# Patient Record
Sex: Female | Born: 1940 | Race: White | Hispanic: No | Marital: Single
Health system: Southern US, Community
[De-identification: ages and names within clinical notes are randomized; demographics above are authoritative.]

---

## 2005-05-11 ENCOUNTER — Encounter: Admission: RE | Admit: 2005-05-11 | Discharge: 2005-05-11 | Payer: Self-pay | Admitting: Neurology

## 2006-01-15 ENCOUNTER — Other Ambulatory Visit: Payer: Self-pay

## 2006-01-15 ENCOUNTER — Inpatient Hospital Stay: Payer: Self-pay | Admitting: Internal Medicine

## 2006-01-16 ENCOUNTER — Other Ambulatory Visit: Payer: Self-pay

## 2006-01-17 ENCOUNTER — Other Ambulatory Visit: Payer: Self-pay

## 2006-01-22 ENCOUNTER — Ambulatory Visit: Payer: Self-pay | Admitting: Specialist

## 2006-03-04 ENCOUNTER — Emergency Department: Payer: Self-pay | Admitting: Emergency Medicine

## 2006-03-05 ENCOUNTER — Other Ambulatory Visit: Payer: Self-pay

## 2009-10-06 ENCOUNTER — Ambulatory Visit: Payer: Self-pay | Admitting: Cardiology

## 2009-10-06 ENCOUNTER — Inpatient Hospital Stay: Payer: Self-pay | Admitting: Internal Medicine

## 2011-03-07 ENCOUNTER — Inpatient Hospital Stay: Payer: Self-pay | Admitting: Specialist

## 2012-01-13 ENCOUNTER — Ambulatory Visit: Payer: Self-pay | Admitting: Internal Medicine

## 2012-01-13 ENCOUNTER — Ambulatory Visit: Payer: Self-pay | Admitting: Oncology

## 2012-01-30 LAB — COMPREHENSIVE METABOLIC PANEL
Albumin: 3.3 g/dL — ABNORMAL LOW (ref 3.4–5.0)
Alkaline Phosphatase: 60 U/L (ref 50–136)
Anion Gap: 9 (ref 7–16)
BUN: 7 mg/dL (ref 7–18)
Bilirubin,Total: 0.6 mg/dL (ref 0.2–1.0)
Calcium, Total: 8.5 mg/dL (ref 8.5–10.1)
Chloride: 96 mmol/L — ABNORMAL LOW (ref 98–107)
Co2: 23 mmol/L (ref 21–32)
Creatinine: 0.64 mg/dL (ref 0.60–1.30)
EGFR (African American): >60
EGFR (Non-African Amer.): >60
Glucose: 101 mg/dL — ABNORMAL HIGH (ref 65–99)
Osmolality: 255 (ref 275–301)
Potassium: 4.3 mmol/L (ref 3.5–5.1)
SGOT(AST): 21 U/L (ref 15–37)
SGPT (ALT): 15 U/L
Sodium: 128 mmol/L — ABNORMAL LOW (ref 136–145)
Total Protein: 6.3 g/dL — ABNORMAL LOW (ref 6.4–8.2)

## 2012-01-30 LAB — CBC
HCT: 33.2 % — ABNORMAL LOW (ref 35.0–47.0)
HGB: 11.3 g/dL — ABNORMAL LOW (ref 12.0–16.0)
MCH: 32.9 pg (ref 26.0–34.0)
MCHC: 33.9 g/dL (ref 32.0–36.0)
MCV: 97 fL (ref 80–100)
Platelet: 226 10*3/uL (ref 150–440)
RBC: 3.43 10*6/uL — ABNORMAL LOW (ref 3.80–5.20)
RDW: 13.8 % (ref 11.5–14.5)
WBC: 11.6 10*3/uL — ABNORMAL HIGH (ref 3.6–11.0)

## 2012-01-30 LAB — CK TOTAL AND CKMB (NOT AT ARMC)
CK, Total: 23 U/L (ref 21–215)
CK-MB: 0.6 ng/mL (ref 0.5–3.6)

## 2012-01-30 LAB — TROPONIN I: Troponin-I: <0.02 ng/mL

## 2012-01-30 LAB — PRO B NATRIURETIC PEPTIDE: B-Type Natriuretic Peptide: 8677 pg/mL — ABNORMAL HIGH (ref 0–125)

## 2012-01-31 ENCOUNTER — Inpatient Hospital Stay: Payer: Self-pay | Admitting: Internal Medicine

## 2012-01-31 LAB — URINALYSIS, COMPLETE
Bacteria: NONE SEEN
Bilirubin,UR: NEGATIVE
Blood: NEGATIVE
Glucose,UR: NEGATIVE mg/dL (ref 0–75)
Nitrite: NEGATIVE
Ph: 7 (ref 4.5–8.0)
Protein: 30
RBC,UR: NONE SEEN /[HPF] (ref 0–5)
Specific Gravity: 1.01 (ref 1.003–1.030)
Squamous Epithelial: 7
WBC UR: 3 /[HPF] (ref 0–5)

## 2012-01-31 LAB — TROPONIN I
Troponin-I: 0.17 ng/mL — ABNORMAL HIGH
Troponin-I: 0.08 ng/mL — ABNORMAL HIGH

## 2012-02-01 LAB — CBC WITH DIFFERENTIAL/PLATELET
Basophil #: 0.1 10*3/uL (ref 0.0–0.1)
Basophil %: 0.6 %
Eosinophil #: 0.4 10*3/uL (ref 0.0–0.7)
Eosinophil %: 4.3 %
HCT: 30.7 % — ABNORMAL LOW (ref 35.0–47.0)
HGB: 10.4 g/dL — ABNORMAL LOW (ref 12.0–16.0)
Lymphocyte %: 27.9 %
Lymphs Abs: 2.5 10*3/uL (ref 1.0–3.6)
MCH: 33 pg (ref 26.0–34.0)
MCHC: 34 g/dL (ref 32.0–36.0)
MCV: 97 fL (ref 80–100)
Monocyte #: 1 x10 3/mm — ABNORMAL HIGH (ref 0.2–0.9)
Monocyte %: 10.7 %
Neutrophil #: 5.1 10*3/uL (ref 1.4–6.5)
Neutrophil %: 56.5 %
Platelet: 176 10*3/uL (ref 150–440)
RBC: 3.16 10*6/uL — ABNORMAL LOW (ref 3.80–5.20)
RDW: 13.6 % (ref 11.5–14.5)
WBC: 8.9 10*3/uL (ref 3.6–11.0)

## 2012-02-01 LAB — COMPREHENSIVE METABOLIC PANEL
Albumin: 3.2 g/dL — ABNORMAL LOW (ref 3.4–5.0)
Alkaline Phosphatase: 52 U/L (ref 50–136)
Anion Gap: 6 — ABNORMAL LOW (ref 7–16)
BUN: 14 mg/dL (ref 7–18)
Bilirubin,Total: 0.4 mg/dL (ref 0.2–1.0)
Calcium, Total: 8.1 mg/dL — ABNORMAL LOW (ref 8.5–10.1)
Chloride: 95 mmol/L — ABNORMAL LOW (ref 98–107)
Co2: 30 mmol/L (ref 21–32)
Creatinine: 0.93 mg/dL (ref 0.60–1.30)
EGFR (African American): >60
EGFR (Non-African Amer.): >60
Glucose: 106 mg/dL — ABNORMAL HIGH (ref 65–99)
Osmolality: 264 (ref 275–301)
Potassium: 3.8 mmol/L (ref 3.5–5.1)
SGOT(AST): 14 U/L — ABNORMAL LOW (ref 15–37)
SGPT (ALT): 13 U/L
Sodium: 131 mmol/L — ABNORMAL LOW (ref 136–145)
Total Protein: 5.7 g/dL — ABNORMAL LOW (ref 6.4–8.2)

## 2012-02-01 LAB — MAGNESIUM: Magnesium: 1.3 mg/dL — ABNORMAL LOW

## 2012-02-05 LAB — CREATININE, SERUM
Creatinine: 1 mg/dL (ref 0.60–1.30)
EGFR (African American): >60
EGFR (Non-African Amer.): 56 — ABNORMAL LOW

## 2012-02-06 LAB — PLATELET COUNT: Platelet: 215 10*3/uL (ref 150–440)

## 2012-02-13 ENCOUNTER — Ambulatory Visit: Payer: Self-pay | Admitting: Oncology

## 2012-02-13 ENCOUNTER — Ambulatory Visit: Payer: Self-pay | Admitting: Internal Medicine

## 2012-02-22 ENCOUNTER — Ambulatory Visit: Payer: Self-pay | Admitting: Oncology

## 2012-04-14 ENCOUNTER — Ambulatory Visit: Payer: Self-pay | Admitting: Internal Medicine

## 2012-04-16 ENCOUNTER — Inpatient Hospital Stay: Payer: Self-pay | Admitting: Student

## 2012-04-16 LAB — CBC
HCT: 33.8 % — ABNORMAL LOW (ref 35.0–47.0)
HGB: 11.6 g/dL — ABNORMAL LOW (ref 12.0–16.0)
MCH: 32.5 pg (ref 26.0–34.0)
MCHC: 34.2 g/dL (ref 32.0–36.0)
MCV: 95 fL (ref 80–100)
Platelet: 252 10*3/uL (ref 150–440)
RBC: 3.56 10*6/uL — ABNORMAL LOW (ref 3.80–5.20)
RDW: 15.9 % — ABNORMAL HIGH (ref 11.5–14.5)
WBC: 12 10*3/uL — ABNORMAL HIGH (ref 3.6–11.0)

## 2012-04-16 LAB — COMPREHENSIVE METABOLIC PANEL
Albumin: 3.5 g/dL (ref 3.4–5.0)
Alkaline Phosphatase: 95 U/L (ref 50–136)
Anion Gap: 8 (ref 7–16)
BUN: 13 mg/dL (ref 7–18)
Bilirubin,Total: 0.6 mg/dL (ref 0.2–1.0)
Calcium, Total: 8.5 mg/dL (ref 8.5–10.1)
Chloride: 99 mmol/L (ref 98–107)
Co2: 25 mmol/L (ref 21–32)
Creatinine: 0.73 mg/dL (ref 0.60–1.30)
EGFR (African American): >60
EGFR (Non-African Amer.): >60
Glucose: 124 mg/dL — ABNORMAL HIGH (ref 65–99)
Osmolality: 266 (ref 275–301)
Potassium: 4.4 mmol/L (ref 3.5–5.1)
SGOT(AST): 38 U/L — ABNORMAL HIGH (ref 15–37)
SGPT (ALT): 43 U/L (ref 12–78)
Sodium: 132 mmol/L — ABNORMAL LOW (ref 136–145)
Total Protein: 6.9 g/dL (ref 6.4–8.2)

## 2012-04-16 LAB — PRO B NATRIURETIC PEPTIDE: B-Type Natriuretic Peptide: 6291 pg/mL — ABNORMAL HIGH (ref 0–125)

## 2012-04-16 LAB — TROPONIN I: Troponin-I: 0.04 ng/mL

## 2012-04-16 LAB — CK TOTAL AND CKMB (NOT AT ARMC)
CK, Total: 51 U/L (ref 21–215)
CK-MB: 1.5 ng/mL (ref 0.5–3.6)

## 2012-04-17 LAB — COMPREHENSIVE METABOLIC PANEL
Albumin: 3 g/dL — ABNORMAL LOW (ref 3.4–5.0)
Alkaline Phosphatase: 84 U/L (ref 50–136)
Anion Gap: 8 (ref 7–16)
BUN: 21 mg/dL — ABNORMAL HIGH (ref 7–18)
Bilirubin,Total: 0.4 mg/dL (ref 0.2–1.0)
Calcium, Total: 8.3 mg/dL — ABNORMAL LOW (ref 8.5–10.1)
Chloride: 98 mmol/L (ref 98–107)
Co2: 26 mmol/L (ref 21–32)
Creatinine: 0.92 mg/dL (ref 0.60–1.30)
EGFR (African American): >60
EGFR (Non-African Amer.): >60
Glucose: 153 mg/dL — ABNORMAL HIGH (ref 65–99)
Osmolality: 271 (ref 275–301)
Potassium: 4.8 mmol/L (ref 3.5–5.1)
SGOT(AST): 40 U/L — ABNORMAL HIGH (ref 15–37)
SGPT (ALT): 57 U/L (ref 12–78)
Sodium: 132 mmol/L — ABNORMAL LOW (ref 136–145)
Total Protein: 6.2 g/dL — ABNORMAL LOW (ref 6.4–8.2)

## 2012-04-17 LAB — CBC WITH DIFFERENTIAL/PLATELET
Basophil #: 0 10*3/uL (ref 0.0–0.1)
Basophil %: 0.1 %
Eosinophil #: 0 10*3/uL (ref 0.0–0.7)
Eosinophil %: 0.1 %
HCT: 29.6 % — ABNORMAL LOW (ref 35.0–47.0)
HGB: 10 g/dL — ABNORMAL LOW (ref 12.0–16.0)
Lymphocyte %: 9.3 %
Lymphs Abs: 0.6 10*3/uL — ABNORMAL LOW (ref 1.0–3.6)
MCH: 32.5 pg (ref 26.0–34.0)
MCHC: 33.9 g/dL (ref 32.0–36.0)
MCV: 96 fL (ref 80–100)
Monocyte #: 0.5 x10 3/mm (ref 0.2–0.9)
Monocyte %: 7 %
Neutrophil #: 5.6 10*3/uL (ref 1.4–6.5)
Neutrophil %: 83.5 %
Platelet: 212 10*3/uL (ref 150–440)
RBC: 3.09 10*6/uL — ABNORMAL LOW (ref 3.80–5.20)
RDW: 15.8 % — ABNORMAL HIGH (ref 11.5–14.5)
WBC: 6.7 10*3/uL (ref 3.6–11.0)

## 2012-04-18 LAB — BASIC METABOLIC PANEL
Anion Gap: 7 (ref 7–16)
BUN: 26 mg/dL — ABNORMAL HIGH (ref 7–18)
Calcium, Total: 8.2 mg/dL — ABNORMAL LOW (ref 8.5–10.1)
Chloride: 97 mmol/L — ABNORMAL LOW (ref 98–107)
Co2: 28 mmol/L (ref 21–32)
Creatinine: 1.04 mg/dL (ref 0.60–1.30)
EGFR (African American): >60
EGFR (Non-African Amer.): 54 — ABNORMAL LOW
Glucose: 151 mg/dL — ABNORMAL HIGH (ref 65–99)
Osmolality: 272 (ref 275–301)
Potassium: 4.7 mmol/L (ref 3.5–5.1)
Sodium: 132 mmol/L — ABNORMAL LOW (ref 136–145)

## 2012-04-19 LAB — BASIC METABOLIC PANEL
Anion Gap: 7 (ref 7–16)
BUN: 29 mg/dL — ABNORMAL HIGH (ref 7–18)
Calcium, Total: 8.1 mg/dL — ABNORMAL LOW (ref 8.5–10.1)
Chloride: 94 mmol/L — ABNORMAL LOW (ref 98–107)
Co2: 29 mmol/L (ref 21–32)
Creatinine: 0.91 mg/dL (ref 0.60–1.30)
EGFR (African American): >60
EGFR (Non-African Amer.): >60
Glucose: 131 mg/dL — ABNORMAL HIGH (ref 65–99)
Osmolality: 268 (ref 275–301)
Potassium: 4.7 mmol/L (ref 3.5–5.1)
Sodium: 130 mmol/L — ABNORMAL LOW (ref 136–145)

## 2012-04-19 LAB — PRO B NATRIURETIC PEPTIDE: B-Type Natriuretic Peptide: 40773 pg/mL — ABNORMAL HIGH (ref 0–125)

## 2012-05-12 LAB — BASIC METABOLIC PANEL
Anion Gap: 7 (ref 7–16)
BUN: 18 mg/dL (ref 7–18)
Calcium, Total: 8.8 mg/dL (ref 8.5–10.1)
Chloride: 85 mmol/L — ABNORMAL LOW (ref 98–107)
Co2: 29 mmol/L (ref 21–32)
Creatinine: 1.01 mg/dL (ref 0.60–1.30)
EGFR (African American): >60
EGFR (Non-African Amer.): 56 — ABNORMAL LOW
Glucose: 81 mg/dL (ref 65–99)
Osmolality: 245 (ref 275–301)
Potassium: 4 mmol/L (ref 3.5–5.1)
Sodium: 121 mmol/L — ABNORMAL LOW (ref 136–145)

## 2012-05-12 LAB — CBC
HCT: 32.3 % — ABNORMAL LOW (ref 35.0–47.0)
HGB: 11.3 g/dL — ABNORMAL LOW (ref 12.0–16.0)
MCH: 33.2 pg (ref 26.0–34.0)
MCHC: 35 g/dL (ref 32.0–36.0)
MCV: 95 fL (ref 80–100)
Platelet: 193 10*3/uL (ref 150–440)
RBC: 3.4 10*6/uL — ABNORMAL LOW (ref 3.80–5.20)
RDW: 14.5 % (ref 11.5–14.5)
WBC: 4.4 10*3/uL (ref 3.6–11.0)

## 2012-05-12 LAB — TROPONIN I: Troponin-I: <0.02 ng/mL

## 2012-05-12 LAB — CK TOTAL AND CKMB (NOT AT ARMC)
CK, Total: 73 U/L (ref 21–215)
CK-MB: 2.4 ng/mL (ref 0.5–3.6)

## 2012-05-13 ENCOUNTER — Inpatient Hospital Stay: Payer: Self-pay | Admitting: Student

## 2012-05-13 LAB — PRO B NATRIURETIC PEPTIDE: B-Type Natriuretic Peptide: 2009 pg/mL — ABNORMAL HIGH (ref 0–125)

## 2012-05-13 LAB — BASIC METABOLIC PANEL
Anion Gap: 8 (ref 7–16)
BUN: 16 mg/dL (ref 7–18)
Calcium, Total: 8.5 mg/dL (ref 8.5–10.1)
Chloride: 86 mmol/L — ABNORMAL LOW (ref 98–107)
Co2: 29 mmol/L (ref 21–32)
Creatinine: 0.95 mg/dL (ref 0.60–1.30)
EGFR (African American): >60
EGFR (Non-African Amer.): 60 — ABNORMAL LOW
Glucose: 78 mg/dL (ref 65–99)
Osmolality: 248 (ref 275–301)
Potassium: 3.8 mmol/L (ref 3.5–5.1)
Sodium: 123 mmol/L — ABNORMAL LOW (ref 136–145)

## 2012-05-14 LAB — CBC WITH DIFFERENTIAL/PLATELET
Basophil #: 0 10*3/uL (ref 0.0–0.1)
Basophil %: 0.6 %
Eosinophil #: 0.3 10*3/uL (ref 0.0–0.7)
Eosinophil %: 5.9 %
HCT: 25.5 % — ABNORMAL LOW (ref 35.0–47.0)
HGB: 9 g/dL — ABNORMAL LOW (ref 12.0–16.0)
Lymphocyte %: 24.5 %
Lymphs Abs: 1.2 10*3/uL (ref 1.0–3.6)
MCH: 33.2 pg (ref 26.0–34.0)
MCHC: 35.2 g/dL (ref 32.0–36.0)
MCV: 94 fL (ref 80–100)
Monocyte #: 0.7 x10 3/mm (ref 0.2–0.9)
Monocyte %: 14.4 %
Neutrophil #: 2.7 10*3/uL (ref 1.4–6.5)
Neutrophil %: 54.6 %
Platelet: 158 10*3/uL (ref 150–440)
RBC: 2.7 10*6/uL — ABNORMAL LOW (ref 3.80–5.20)
RDW: 14.1 % (ref 11.5–14.5)
WBC: 5 10*3/uL (ref 3.6–11.0)

## 2012-05-14 LAB — BASIC METABOLIC PANEL
Anion Gap: 5 — ABNORMAL LOW (ref 7–16)
BUN: 15 mg/dL (ref 7–18)
Calcium, Total: 8 mg/dL — ABNORMAL LOW (ref 8.5–10.1)
Chloride: 90 mmol/L — ABNORMAL LOW (ref 98–107)
Co2: 31 mmol/L (ref 21–32)
Creatinine: 0.92 mg/dL (ref 0.60–1.30)
EGFR (African American): >60
EGFR (Non-African Amer.): >60
Glucose: 104 mg/dL — ABNORMAL HIGH (ref 65–99)
Osmolality: 254 (ref 275–301)
Potassium: 3.9 mmol/L (ref 3.5–5.1)
Sodium: 126 mmol/L — ABNORMAL LOW (ref 136–145)

## 2012-05-14 LAB — MAGNESIUM: Magnesium: 1.6 mg/dL — ABNORMAL LOW

## 2012-05-15 ENCOUNTER — Ambulatory Visit: Payer: Self-pay | Admitting: Internal Medicine

## 2012-05-15 LAB — BASIC METABOLIC PANEL
Anion Gap: 4 — ABNORMAL LOW (ref 7–16)
BUN: 17 mg/dL (ref 7–18)
Calcium, Total: 8.3 mg/dL — ABNORMAL LOW (ref 8.5–10.1)
Chloride: 90 mmol/L — ABNORMAL LOW (ref 98–107)
Co2: 32 mmol/L (ref 21–32)
Creatinine: 0.79 mg/dL (ref 0.60–1.30)
EGFR (African American): >60
EGFR (Non-African Amer.): >60
Glucose: 103 mg/dL — ABNORMAL HIGH (ref 65–99)
Osmolality: 255 (ref 275–301)
Potassium: 4.3 mmol/L (ref 3.5–5.1)
Sodium: 126 mmol/L — ABNORMAL LOW (ref 136–145)

## 2012-05-15 LAB — CBC WITH DIFFERENTIAL/PLATELET
Basophil #: 0 10*3/uL (ref 0.0–0.1)
Basophil %: 0.5 %
Eosinophil #: 0.4 10*3/uL (ref 0.0–0.7)
Eosinophil %: 8.7 %
HCT: 26.1 % — ABNORMAL LOW (ref 35.0–47.0)
HGB: 9.3 g/dL — ABNORMAL LOW (ref 12.0–16.0)
Lymphocyte %: 31.2 %
Lymphs Abs: 1.6 10*3/uL (ref 1.0–3.6)
MCH: 33.7 pg (ref 26.0–34.0)
MCHC: 35.6 g/dL (ref 32.0–36.0)
MCV: 95 fL (ref 80–100)
Monocyte #: 0.7 x10 3/mm (ref 0.2–0.9)
Monocyte %: 14.1 %
Neutrophil #: 2.3 10*3/uL (ref 1.4–6.5)
Neutrophil %: 45.5 %
Platelet: 178 10*3/uL (ref 150–440)
RBC: 2.76 10*6/uL — ABNORMAL LOW (ref 3.80–5.20)
RDW: 14.4 % (ref 11.5–14.5)
WBC: 5.1 10*3/uL (ref 3.6–11.0)

## 2012-06-14 ENCOUNTER — Emergency Department: Payer: Self-pay | Admitting: Emergency Medicine

## 2012-06-14 LAB — BASIC METABOLIC PANEL
Anion Gap: 6 — ABNORMAL LOW (ref 7–16)
BUN: 18 mg/dL (ref 7–18)
Calcium, Total: 8.8 mg/dL (ref 8.5–10.1)
Chloride: 98 mmol/L (ref 98–107)
Co2: 27 mmol/L (ref 21–32)
Creatinine: 0.67 mg/dL (ref 0.60–1.30)
EGFR (African American): >60
EGFR (Non-African Amer.): >60
Glucose: 113 mg/dL — ABNORMAL HIGH (ref 65–99)
Osmolality: 265 (ref 275–301)
Potassium: 4.3 mmol/L (ref 3.5–5.1)
Sodium: 131 mmol/L — ABNORMAL LOW (ref 136–145)

## 2012-06-14 LAB — CBC
HCT: 32.6 % — ABNORMAL LOW (ref 35.0–47.0)
HGB: 11.3 g/dL — ABNORMAL LOW (ref 12.0–16.0)
MCH: 32.8 pg (ref 26.0–34.0)
MCHC: 34.7 g/dL (ref 32.0–36.0)
MCV: 95 fL (ref 80–100)
Platelet: 237 10*3/uL (ref 150–440)
RBC: 3.45 10*6/uL — ABNORMAL LOW (ref 3.80–5.20)
RDW: 14.6 % — ABNORMAL HIGH (ref 11.5–14.5)
WBC: 6.5 10*3/uL (ref 3.6–11.0)

## 2012-06-14 LAB — URINALYSIS, COMPLETE
Bacteria: NONE SEEN
Bilirubin,UR: NEGATIVE
Blood: NEGATIVE
Glucose,UR: NEGATIVE mg/dL (ref 0–75)
Ketone: NEGATIVE
Leukocyte Esterase: NEGATIVE
Nitrite: NEGATIVE
Ph: 8 (ref 4.5–8.0)
Protein: NEGATIVE
RBC,UR: NONE SEEN /[HPF] (ref 0–5)
Specific Gravity: 1.013 (ref 1.003–1.030)
Squamous Epithelial: 1
WBC UR: 1 /[HPF] (ref 0–5)

## 2012-06-14 LAB — TROPONIN I: Troponin-I: <0.02 ng/mL

## 2012-06-14 LAB — PRO B NATRIURETIC PEPTIDE: B-Type Natriuretic Peptide: 2030 pg/mL — ABNORMAL HIGH (ref 0–125)

## 2012-07-10 ENCOUNTER — Emergency Department: Payer: Self-pay | Admitting: Internal Medicine

## 2012-07-10 LAB — COMPREHENSIVE METABOLIC PANEL
Albumin: 3.2 g/dL — ABNORMAL LOW (ref 3.4–5.0)
Alkaline Phosphatase: 71 U/L (ref 50–136)
Anion Gap: 8 (ref 7–16)
BUN: 8 mg/dL (ref 7–18)
Bilirubin,Total: 0.6 mg/dL (ref 0.2–1.0)
Calcium, Total: 8.3 mg/dL — ABNORMAL LOW (ref 8.5–10.1)
Chloride: 98 mmol/L (ref 98–107)
Co2: 25 mmol/L (ref 21–32)
Creatinine: 0.61 mg/dL (ref 0.60–1.30)
EGFR (African American): >60
EGFR (Non-African Amer.): >60
Glucose: 93 mg/dL (ref 65–99)
Osmolality: 261 (ref 275–301)
Potassium: 4.6 mmol/L (ref 3.5–5.1)
SGOT(AST): 26 U/L (ref 15–37)
SGPT (ALT): 24 U/L (ref 12–78)
Sodium: 131 mmol/L — ABNORMAL LOW (ref 136–145)
Total Protein: 6.1 g/dL — ABNORMAL LOW (ref 6.4–8.2)

## 2012-07-10 LAB — URINALYSIS, COMPLETE
Bilirubin,UR: NEGATIVE
Blood: NEGATIVE
Glucose,UR: NEGATIVE mg/dL (ref 0–75)
Ketone: NEGATIVE
Nitrite: POSITIVE
Ph: 7 (ref 4.5–8.0)
Protein: NEGATIVE
RBC,UR: 3 /[HPF] (ref 0–5)
Specific Gravity: 1.013 (ref 1.003–1.030)
Squamous Epithelial: <1
WBC UR: 8 /[HPF] (ref 0–5)

## 2012-07-10 LAB — TROPONIN I: Troponin-I: <0.02 ng/mL

## 2012-07-10 LAB — CBC
HCT: 33.2 % — ABNORMAL LOW (ref 35.0–47.0)
HGB: 11.2 g/dL — ABNORMAL LOW (ref 12.0–16.0)
MCH: 31.9 pg (ref 26.0–34.0)
MCHC: 33.7 g/dL (ref 32.0–36.0)
MCV: 95 fL (ref 80–100)
Platelet: 233 10*3/uL (ref 150–440)
RBC: 3.5 10*6/uL — ABNORMAL LOW (ref 3.80–5.20)
RDW: 15.1 % — ABNORMAL HIGH (ref 11.5–14.5)
WBC: 9.5 10*3/uL (ref 3.6–11.0)

## 2012-07-10 LAB — TSH: Thyroid Stimulating Horm: 0.949 u[IU]/mL

## 2012-07-10 LAB — PRO B NATRIURETIC PEPTIDE: B-Type Natriuretic Peptide: 8135 pg/mL — ABNORMAL HIGH (ref 0–125)

## 2012-07-10 LAB — CK TOTAL AND CKMB (NOT AT ARMC)
CK, Total: 44 U/L (ref 21–215)
CK-MB: 1 ng/mL (ref 0.5–3.6)

## 2012-07-13 ENCOUNTER — Inpatient Hospital Stay: Payer: Self-pay | Admitting: Internal Medicine

## 2012-07-13 LAB — URINALYSIS, COMPLETE
Bilirubin,UR: NEGATIVE
Glucose,UR: NEGATIVE mg/dL (ref 0–75)
Hyaline Cast: 3
Nitrite: POSITIVE
Ph: 6 (ref 4.5–8.0)
Protein: 100
RBC,UR: 3 /[HPF] (ref 0–5)
Specific Gravity: 1.012 (ref 1.003–1.030)
Squamous Epithelial: 4
WBC UR: 29 /[HPF] (ref 0–5)

## 2012-07-13 LAB — TROPONIN I
Troponin-I: 0.57 ng/mL — ABNORMAL HIGH
Troponin-I: 3.9 ng/mL — ABNORMAL HIGH
Troponin-I: 3.1 ng/mL — ABNORMAL HIGH

## 2012-07-13 LAB — CK TOTAL AND CKMB (NOT AT ARMC)
CK, Total: 55 U/L (ref 21–215)
CK-MB: 2.3 ng/mL (ref 0.5–3.6)

## 2012-07-13 LAB — CBC
HCT: 36 % (ref 35.0–47.0)
HGB: 11.8 g/dL — ABNORMAL LOW (ref 12.0–16.0)
MCH: 31.3 pg (ref 26.0–34.0)
MCHC: 32.8 g/dL (ref 32.0–36.0)
MCV: 96 fL (ref 80–100)
Platelet: 262 10*3/uL (ref 150–440)
RBC: 3.77 10*6/uL — ABNORMAL LOW (ref 3.80–5.20)
RDW: 15.4 % — ABNORMAL HIGH (ref 11.5–14.5)
WBC: 18.9 10*3/uL — ABNORMAL HIGH (ref 3.6–11.0)

## 2012-07-13 LAB — BASIC METABOLIC PANEL
Anion Gap: 12 (ref 7–16)
BUN: 12 mg/dL (ref 7–18)
Calcium, Total: 8.7 mg/dL (ref 8.5–10.1)
Chloride: 100 mmol/L (ref 98–107)
Co2: 25 mmol/L (ref 21–32)
Creatinine: 0.9 mg/dL (ref 0.60–1.30)
EGFR (African American): >60
EGFR (Non-African Amer.): >60
Glucose: 188 mg/dL — ABNORMAL HIGH (ref 65–99)
Osmolality: 279 (ref 275–301)
Potassium: 4.1 mmol/L (ref 3.5–5.1)
Sodium: 137 mmol/L (ref 136–145)

## 2012-07-13 LAB — CK-MB
CK-MB: 11.2 ng/mL — ABNORMAL HIGH (ref 0.5–3.6)
CK-MB: 6.5 ng/mL — ABNORMAL HIGH (ref 0.5–3.6)

## 2012-07-14 LAB — BASIC METABOLIC PANEL
Anion Gap: 9 (ref 7–16)
BUN: 16 mg/dL (ref 7–18)
Calcium, Total: 8.3 mg/dL — ABNORMAL LOW (ref 8.5–10.1)
Chloride: 101 mmol/L (ref 98–107)
Co2: 29 mmol/L (ref 21–32)
Creatinine: 0.82 mg/dL (ref 0.60–1.30)
EGFR (African American): >60
EGFR (Non-African Amer.): >60
Glucose: 99 mg/dL (ref 65–99)
Osmolality: 279 (ref 275–301)
Potassium: 3.8 mmol/L (ref 3.5–5.1)
Sodium: 139 mmol/L (ref 136–145)

## 2012-07-14 LAB — CBC WITH DIFFERENTIAL/PLATELET
Basophil #: 0.1 10*3/uL (ref 0.0–0.1)
Basophil %: 0.5 %
Eosinophil #: 0.2 10*3/uL (ref 0.0–0.7)
Eosinophil %: 2 %
HCT: 30.3 % — ABNORMAL LOW (ref 35.0–47.0)
HGB: 10.2 g/dL — ABNORMAL LOW (ref 12.0–16.0)
Lymphocyte %: 13.3 %
Lymphs Abs: 1.3 10*3/uL (ref 1.0–3.6)
MCH: 32.2 pg (ref 26.0–34.0)
MCHC: 33.7 g/dL (ref 32.0–36.0)
MCV: 96 fL (ref 80–100)
Monocyte #: 1.2 x10 3/mm — ABNORMAL HIGH (ref 0.2–0.9)
Monocyte %: 12.2 %
Neutrophil #: 7.2 10*3/uL — ABNORMAL HIGH (ref 1.4–6.5)
Neutrophil %: 72 %
Platelet: 170 10*3/uL (ref 150–440)
RBC: 3.17 10*6/uL — ABNORMAL LOW (ref 3.80–5.20)
RDW: 15 % — ABNORMAL HIGH (ref 11.5–14.5)
WBC: 10 10*3/uL (ref 3.6–11.0)

## 2012-07-15 LAB — BASIC METABOLIC PANEL
Anion Gap: 8 (ref 7–16)
BUN: 22 mg/dL — ABNORMAL HIGH (ref 7–18)
Calcium, Total: 8.4 mg/dL — ABNORMAL LOW (ref 8.5–10.1)
Chloride: 99 mmol/L (ref 98–107)
Co2: 30 mmol/L (ref 21–32)
Creatinine: 0.97 mg/dL (ref 0.60–1.30)
EGFR (African American): >60
EGFR (Non-African Amer.): 58 — ABNORMAL LOW
Glucose: 96 mg/dL (ref 65–99)
Osmolality: 277 (ref 275–301)
Potassium: 3.4 mmol/L — ABNORMAL LOW (ref 3.5–5.1)
Sodium: 137 mmol/L (ref 136–145)

## 2012-07-15 LAB — URINE CULTURE

## 2012-07-19 LAB — CULTURE, BLOOD (SINGLE)

## 2012-08-12 ENCOUNTER — Inpatient Hospital Stay: Payer: Self-pay | Admitting: Internal Medicine

## 2012-08-12 LAB — COMPREHENSIVE METABOLIC PANEL
Albumin: 3 g/dL — ABNORMAL LOW (ref 3.4–5.0)
Alkaline Phosphatase: 64 U/L (ref 50–136)
Anion Gap: 10 (ref 7–16)
BUN: 15 mg/dL (ref 7–18)
Bilirubin,Total: 0.9 mg/dL (ref 0.2–1.0)
Calcium, Total: 8.1 mg/dL — ABNORMAL LOW (ref 8.5–10.1)
Chloride: 99 mmol/L (ref 98–107)
Co2: 27 mmol/L (ref 21–32)
Creatinine: 0.94 mg/dL (ref 0.60–1.30)
EGFR (African American): >60
EGFR (Non-African Amer.): >60
Glucose: 145 mg/dL — ABNORMAL HIGH (ref 65–99)
Osmolality: 275 (ref 275–301)
Potassium: 2.8 mmol/L — ABNORMAL LOW (ref 3.5–5.1)
SGOT(AST): 34 U/L (ref 15–37)
SGPT (ALT): 17 U/L (ref 12–78)
Sodium: 136 mmol/L (ref 136–145)
Total Protein: 6.5 g/dL (ref 6.4–8.2)

## 2012-08-12 LAB — URINALYSIS, COMPLETE
Bilirubin,UR: NEGATIVE
Glucose,UR: NEGATIVE mg/dL (ref 0–75)
Hyaline Cast: 28
Nitrite: NEGATIVE
Ph: 5 (ref 4.5–8.0)
Protein: 30
RBC,UR: 21 /[HPF] (ref 0–5)
Specific Gravity: 1.012 (ref 1.003–1.030)
Squamous Epithelial: 4
WBC UR: 331 /[HPF] (ref 0–5)

## 2012-08-12 LAB — CBC WITH DIFFERENTIAL/PLATELET
Basophil #: 0.1 10*3/uL (ref 0.0–0.1)
Basophil %: 0.2 %
Eosinophil #: 0.1 10*3/uL (ref 0.0–0.7)
Eosinophil %: 0.6 %
HCT: 36.9 % (ref 35.0–47.0)
HGB: 12.1 g/dL (ref 12.0–16.0)
Lymphocyte %: 5.2 %
Lymphs Abs: 1.2 10*3/uL (ref 1.0–3.6)
MCH: 31.1 pg (ref 26.0–34.0)
MCHC: 32.8 g/dL (ref 32.0–36.0)
MCV: 95 fL (ref 80–100)
Monocyte #: 1.7 x10 3/mm — ABNORMAL HIGH (ref 0.2–0.9)
Monocyte %: 7.7 %
Neutrophil #: 19.2 10*3/uL — ABNORMAL HIGH (ref 1.4–6.5)
Neutrophil %: 86.3 %
Platelet: 302 10*3/uL (ref 150–440)
RBC: 3.9 10*6/uL (ref 3.80–5.20)
RDW: 15 % — ABNORMAL HIGH (ref 11.5–14.5)
WBC: 22.3 10*3/uL — ABNORMAL HIGH (ref 3.6–11.0)

## 2012-08-12 LAB — TROPONIN I: Troponin-I: 0.04 ng/mL

## 2012-08-13 LAB — BASIC METABOLIC PANEL
Anion Gap: 12 (ref 7–16)
BUN: 15 mg/dL (ref 7–18)
Calcium, Total: 7.7 mg/dL — ABNORMAL LOW (ref 8.5–10.1)
Chloride: 103 mmol/L (ref 98–107)
Co2: 28 mmol/L (ref 21–32)
Creatinine: 0.85 mg/dL (ref 0.60–1.30)
EGFR (African American): >60
EGFR (Non-African Amer.): >60
Glucose: 93 mg/dL (ref 65–99)
Osmolality: 286 (ref 275–301)
Potassium: 2.7 mmol/L — ABNORMAL LOW (ref 3.5–5.1)
Sodium: 143 mmol/L (ref 136–145)

## 2012-08-13 LAB — CBC WITH DIFFERENTIAL/PLATELET
Basophil #: 0.1 10*3/uL (ref 0.0–0.1)
Basophil %: 0.4 %
Eosinophil #: 0 10*3/uL (ref 0.0–0.7)
Eosinophil %: 0.1 %
HCT: 30.7 % — ABNORMAL LOW (ref 35.0–47.0)
HGB: 10.1 g/dL — ABNORMAL LOW (ref 12.0–16.0)
Lymphocyte %: 5 %
Lymphs Abs: 0.9 10*3/uL — ABNORMAL LOW (ref 1.0–3.6)
MCH: 30.9 pg (ref 26.0–34.0)
MCHC: 32.9 g/dL (ref 32.0–36.0)
MCV: 94 fL (ref 80–100)
Monocyte #: 1.2 x10 3/mm — ABNORMAL HIGH (ref 0.2–0.9)
Monocyte %: 6.4 %
Neutrophil #: 16.4 10*3/uL — ABNORMAL HIGH (ref 1.4–6.5)
Neutrophil %: 88.1 %
Platelet: 202 10*3/uL (ref 150–440)
RBC: 3.27 10*6/uL — ABNORMAL LOW (ref 3.80–5.20)
RDW: 14.8 % — ABNORMAL HIGH (ref 11.5–14.5)
WBC: 18.7 10*3/uL — ABNORMAL HIGH (ref 3.6–11.0)

## 2012-08-13 LAB — MAGNESIUM: Magnesium: 1.1 mg/dL — ABNORMAL LOW

## 2012-08-14 LAB — CBC WITH DIFFERENTIAL/PLATELET
Basophil #: 0.1 10*3/uL (ref 0.0–0.1)
Basophil %: 0.4 %
Eosinophil #: 0.2 10*3/uL (ref 0.0–0.7)
Eosinophil %: 0.9 %
HCT: 32.2 % — ABNORMAL LOW (ref 35.0–47.0)
HGB: 10.9 g/dL — ABNORMAL LOW (ref 12.0–16.0)
Lymphocyte %: 9.3 %
Lymphs Abs: 1.6 10*3/uL (ref 1.0–3.6)
MCH: 31.6 pg (ref 26.0–34.0)
MCHC: 33.9 g/dL (ref 32.0–36.0)
MCV: 93 fL (ref 80–100)
Monocyte #: 1.8 x10 3/mm — ABNORMAL HIGH (ref 0.2–0.9)
Monocyte %: 10.4 %
Neutrophil #: 13.4 10*3/uL — ABNORMAL HIGH (ref 1.4–6.5)
Neutrophil %: 79 %
Platelet: 227 10*3/uL (ref 150–440)
RBC: 3.46 10*6/uL — ABNORMAL LOW (ref 3.80–5.20)
RDW: 15.1 % — ABNORMAL HIGH (ref 11.5–14.5)
WBC: 16.9 10*3/uL — ABNORMAL HIGH (ref 3.6–11.0)

## 2012-08-14 LAB — BASIC METABOLIC PANEL
Anion Gap: 7 (ref 7–16)
BUN: 15 mg/dL (ref 7–18)
Calcium, Total: 7.9 mg/dL — ABNORMAL LOW (ref 8.5–10.1)
Chloride: 103 mmol/L (ref 98–107)
Co2: 33 mmol/L — ABNORMAL HIGH (ref 21–32)
Creatinine: 0.88 mg/dL (ref 0.60–1.30)
EGFR (African American): >60
EGFR (Non-African Amer.): >60
Glucose: 127 mg/dL — ABNORMAL HIGH (ref 65–99)
Osmolality: 287 (ref 275–301)
Potassium: 3.2 mmol/L — ABNORMAL LOW (ref 3.5–5.1)
Sodium: 143 mmol/L (ref 136–145)

## 2012-08-14 LAB — MAGNESIUM: Magnesium: 1.6 mg/dL — ABNORMAL LOW

## 2012-08-15 LAB — VANCOMYCIN, TROUGH: Vancomycin, Trough: 14 ug/mL (ref 10–20)

## 2012-08-16 LAB — CBC WITH DIFFERENTIAL/PLATELET
Basophil #: 0 10*3/uL (ref 0.0–0.1)
Basophil %: 0.3 %
Eosinophil #: 0.1 10*3/uL (ref 0.0–0.7)
Eosinophil %: 0.4 %
HCT: 34.2 % — ABNORMAL LOW (ref 35.0–47.0)
HGB: 11.2 g/dL — ABNORMAL LOW (ref 12.0–16.0)
Lymphocyte %: 3.9 %
Lymphs Abs: 0.7 10*3/uL — ABNORMAL LOW (ref 1.0–3.6)
MCH: 31 pg (ref 26.0–34.0)
MCHC: 32.7 g/dL (ref 32.0–36.0)
MCV: 95 fL (ref 80–100)
Monocyte #: 0.6 x10 3/mm (ref 0.2–0.9)
Monocyte %: 3.4 %
Neutrophil #: 16.7 10*3/uL — ABNORMAL HIGH (ref 1.4–6.5)
Neutrophil %: 92 %
Platelet: 220 10*3/uL (ref 150–440)
RBC: 3.6 10*6/uL — ABNORMAL LOW (ref 3.80–5.20)
RDW: 15.4 % — ABNORMAL HIGH (ref 11.5–14.5)
WBC: 18.2 10*3/uL — ABNORMAL HIGH (ref 3.6–11.0)

## 2012-08-16 LAB — BASIC METABOLIC PANEL
Anion Gap: 8 (ref 7–16)
BUN: 23 mg/dL — ABNORMAL HIGH (ref 7–18)
Calcium, Total: 8.2 mg/dL — ABNORMAL LOW (ref 8.5–10.1)
Chloride: 105 mmol/L (ref 98–107)
Co2: 27 mmol/L (ref 21–32)
Creatinine: 1.36 mg/dL — ABNORMAL HIGH (ref 0.60–1.30)
EGFR (African American): 45 — ABNORMAL LOW
EGFR (Non-African Amer.): 39 — ABNORMAL LOW
Glucose: 186 mg/dL — ABNORMAL HIGH (ref 65–99)
Osmolality: 288 (ref 275–301)
Potassium: 4.6 mmol/L (ref 3.5–5.1)
Sodium: 140 mmol/L (ref 136–145)

## 2012-08-17 LAB — URINE CULTURE

## 2012-08-18 LAB — CULTURE, BLOOD (SINGLE)

## 2012-09-14 DEATH — deceased

## 2013-03-12 IMAGING — CR DG CHEST 1V
1 series · 1 of 1 positions shown · non-contrast
Comparison: none

REASON FOR EXAM: difficulty breathing
COMMENTS:

[ap]
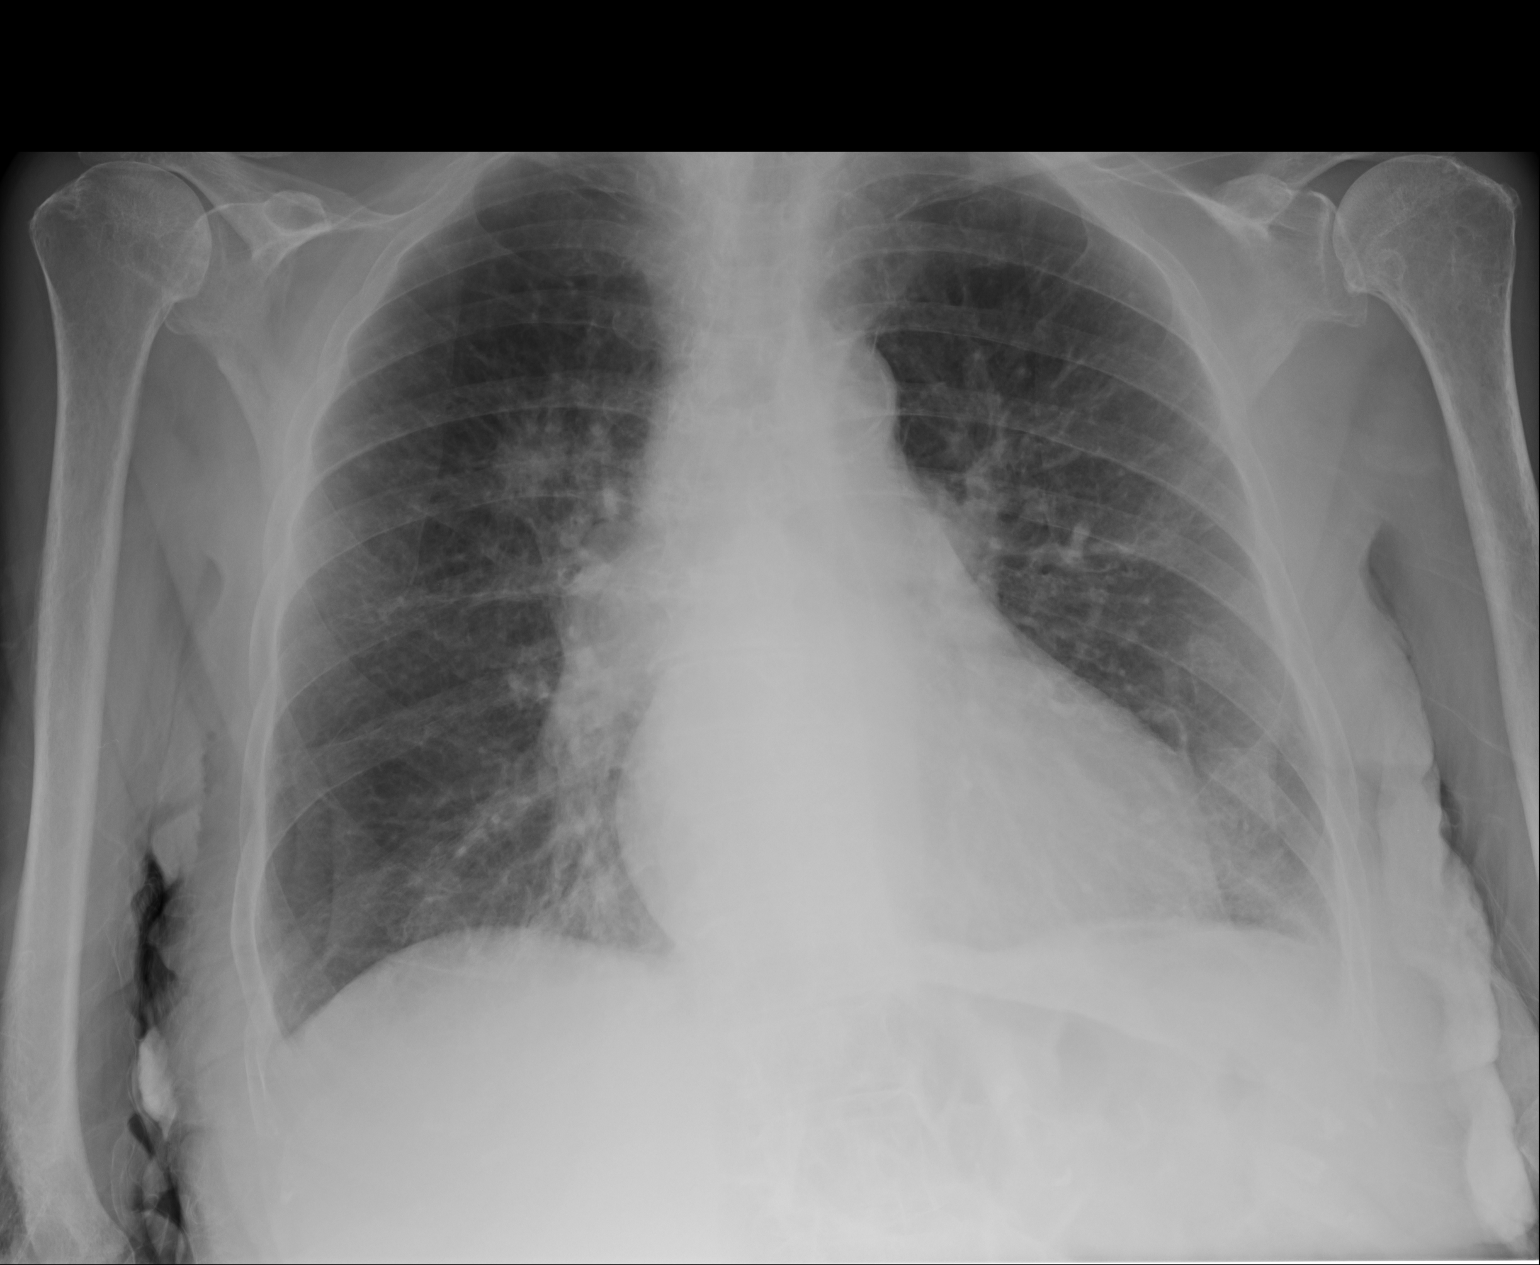

[1 of 1 positions shown; findings below may reference images not displayed]

PROCEDURE:     DXR - DXR CHEST 1 VIEWAP OR PA  - May 12, 2012 [DATE]

RESULT:     Comparison is made to previous images dated 30 January, 2012 and 16 April, 2012.

There is irregular density in the right suprahilar region which correlates
with a previous PET positive abnormality. Nodular density projects laterally
in the lower third of the left lung which may represent superimposition of
an anterior rib and with a posterior rib. Underlying developing nodule is
not completely excluded. The interstitial markings remain prominent when
compared to the study 16 April, 2012. The heart size is normal. There is
no effusion or pneumothorax evident. Basilar atelectasis is present. Bony
structures are unremarkable.
IMPRESSION: 1. Right lung mass at the level of the AP window to hilum. This is a known
PET positive lesion.
2. Minimal basilar atelectasis on the left. Slightly improved aeration at
the right lung base compared to the most recent study with persistent
diffuse prominence of the interstitial markings.

[REDACTED]

## 2013-05-10 IMAGING — CR DG CHEST 1V PORT
1 series · 1 of 1 positions shown · non-contrast
Comparison: none

REASON FOR EXAM: shortness of breath
COMMENTS:

PROCEDURE:     DXR - DXR PORTABLE CHEST SINGLE VIEW  - July 10, 2012 [DATE]
RESULT:

[ap]
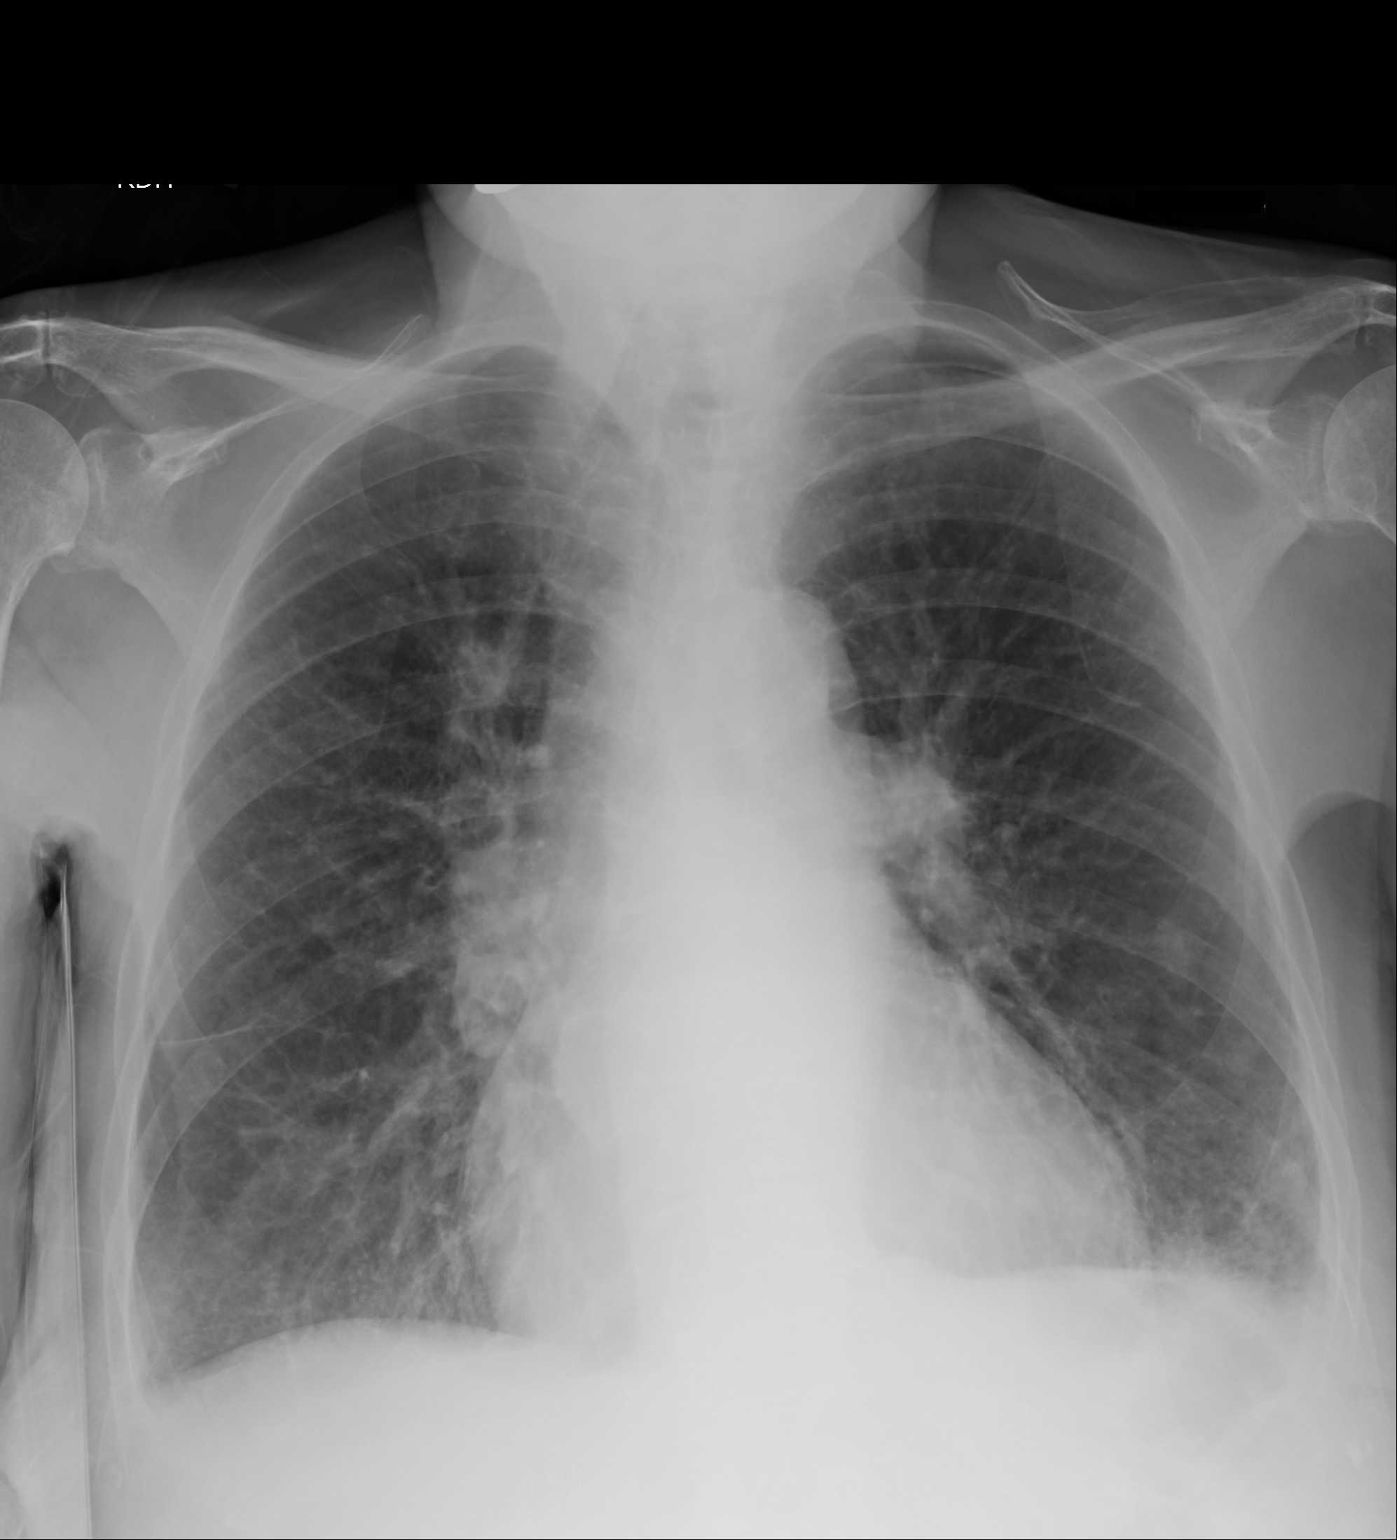

[1 of 1 positions shown; findings below may reference images not displayed]

FINDINGS: There is prominence of the interstitial markings without
significant peribronchial cuffing. An area of increased density with a
nodular appearance projects within the right suprahilar region. This study
was compared to a previous study dated 06/14/2012. A second ill-defined area
with a nodular appearance projects along the periphery of the left mid
hemithorax and along the periphery of the left lower lobe. There is blunting
of the costophrenic angles. The cardiac silhouette is enlarged. The
visualized bony skeleton is unremarkable.
IMPRESSION: 1. Interstitial infiltrate. Differential considerations are infectious
versus inflammatory. Pulmonary edema cannot be excluded. An underlying
component of pulmonary fibrosis is of diagnostic consideration.
2. Nodular appearing density in the right suprahilar region, clinical
correlation recommended. Ill-defined areas of nodular appearances projects
in the left lobe region. These areas are vague and may simply represent
confluence of densities. Surveillance evaluation is recommended.

## 2013-05-13 IMAGING — CR DG CHEST 1V PORT
1 series · 1 of 1 positions shown · non-contrast
Comparison: none

REASON FOR EXAM: SOB
COMMENTS:

[ap]
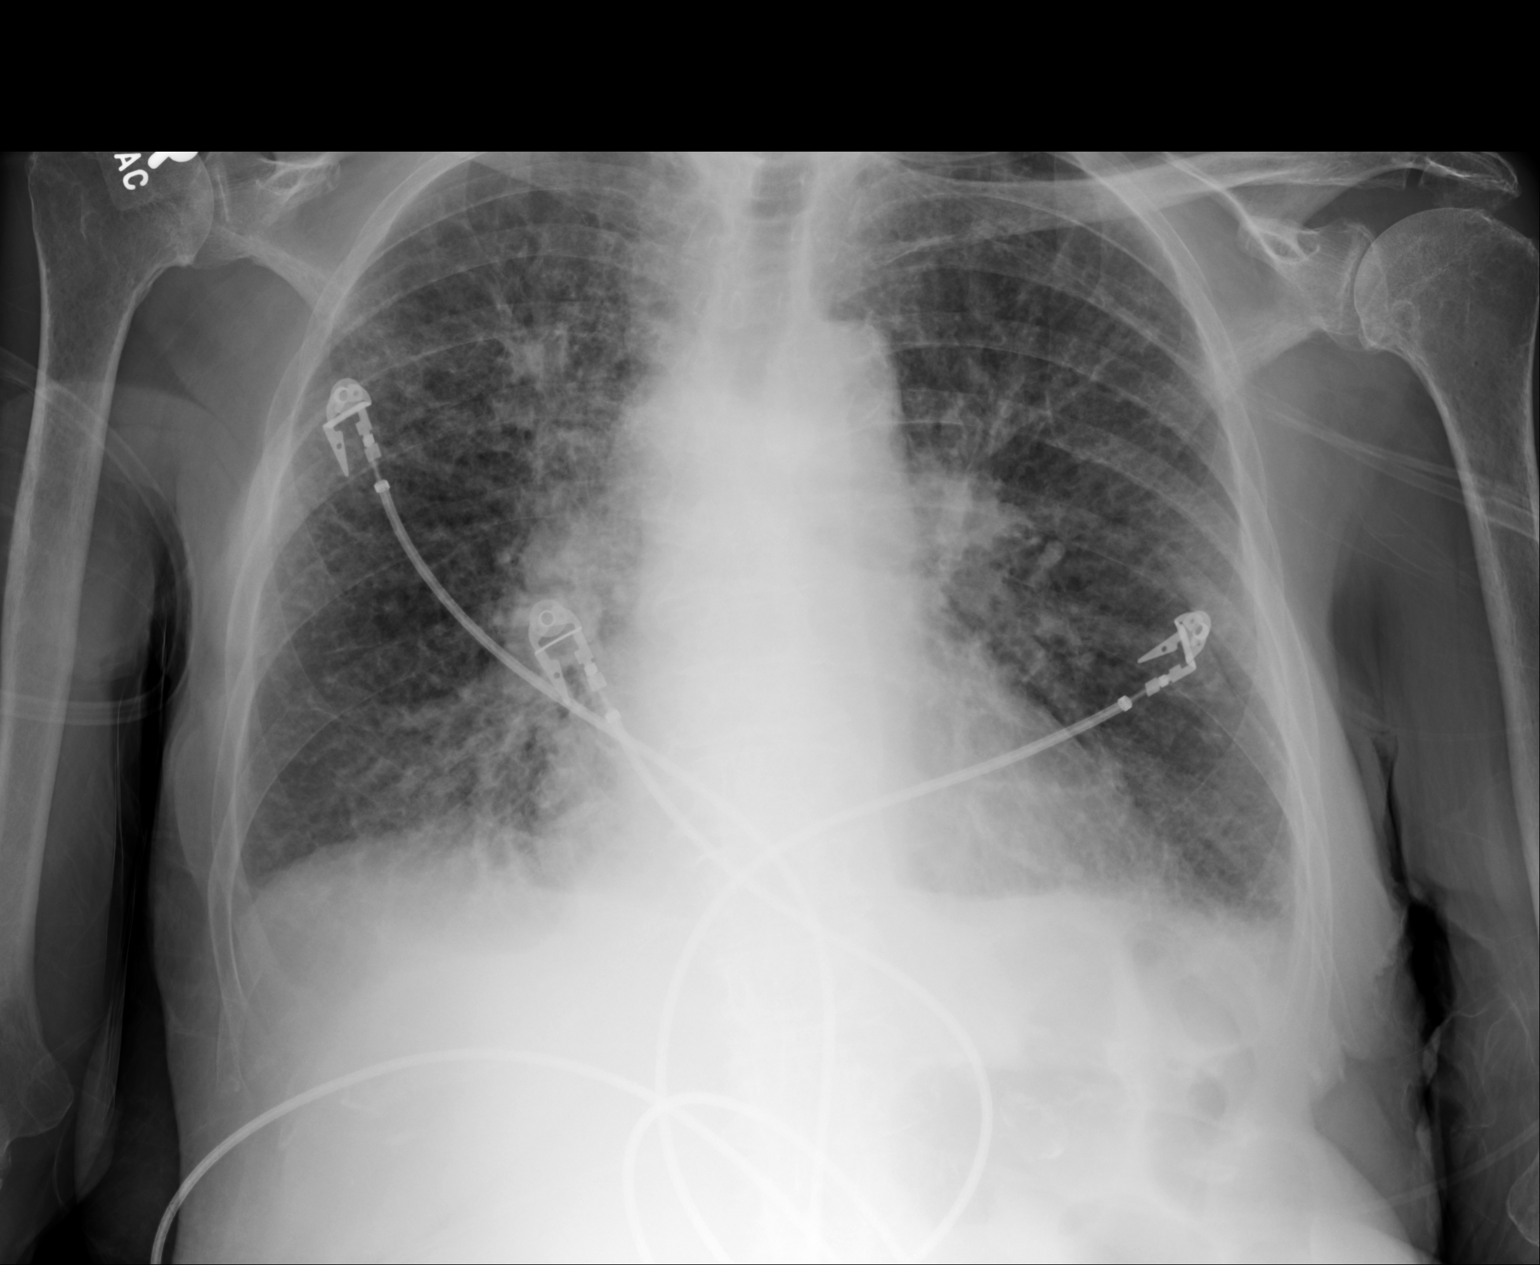

[1 of 1 positions shown; findings below may reference images not displayed]

PROCEDURE:     DXR - DXR PORTABLE CHEST SINGLE VIEW  - July 13, 2012  [DATE]

RESULT:

Comparison is made to a prior study dated 07/10/2012. In the interim, there
has been development of diffuse thickening of the interstitial markings and
peribronchial cuffing. The lungs demonstrate a fine reticulonodular
parenchymal pattern. The cardiac silhouette is enlarged and the visualized
bony skeleton demonstrates no gross abnormalities. The patient has taken a
shallow inspiration.
IMPRESSION: Findings likely reflecting interstitial infiltrate, e.g.,
pulmonary edema considering the relative acuity of these findings.
Nonedematous infectious or inflammatory infiltrate cannot be excluded, if
clinically warranted.
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
## 2015-01-01 NOTE — H&P (Signed)
PATIENT NAME:  Donna Ho, Donna Ho MR#:  [id number] DATE OF BIRTH:  19-Feb-1941  DATE OF ADMISSION:  07/13/2012  PRIMARY CARE PHYSICIAN: Dr. Corky Downs   CODE STATUS: DO NOT RESUSCITATE. Surrogate decision maker is her daughter, Toy Baker, and her granddaughter, Charlotta Lapaglia.    CHIEF COMPLAINT: Shortness of breath.   HISTORY OF PRESENT ILLNESS: The patient is a 74 year old female with a history of metastatic lung cancer and diastolic congestive heart failure who presents with progressive dyspnea of a few days duration. The patient presented to the Emergency Department three days ago was evaluated and noted to have some pulmonary edema, was diuresed and sent home. She was evaluated by her primary care provider one day prior to admission for difficulty breathing and shortness of breath. Her symptoms progressed throughout the day so due to persistent dyspnea her daughter alerted EMS and upon evaluation she was noted to be in supraventricular tachycardia with a rate in the 180's. In the Emergency Department she slowed down enough and it was noted that she was actually in atrial fibrillation with rapid ventricular response. She received diltiazem which has controlled her rate significantly and lasix for diuresis.   Her oxygen requirement has increased. She is usually at about 3 liters at home. She is now requiring 5 liters nasal cannula. Repeat x-rays today show increased pulmonary edema so we are called to admit her. The patient also has a urinary tract infection.   Regarding her shortness of breath, symptoms are quite severe. They are exacerbated by laying flat and she did have some associated palpitations but denied chest pain or cough.   PAST MEDICAL HISTORY:  1. Chronic diastolic congestive heart failure.  2. Chronic obstructive pulmonary disease.  3. Tobacco abuse.  4. History of chronic respiratory failure requiring 3 to 4 liters nasal cannula  5. Coronary artery disease.  6. Severe peripheral  neuropathy.  7. Lung cancer with metastases.  8. Chronic pain syndrome.  9. Chronic hyponatremia.  10. Healing chronic wound on the plantar aspect of the left foot over the fifth metatarsal bone.  HOME MEDICATIONS:  1. Alprazolam 0.25 mg 1 to 2 tablets every four hours as needed for anxiety.  2. Aspirin 81 mg daily.  3. Carvedilol 6.25 mg twice a day.  4. Citalopram 40 mg daily.  5. Furosemide 40 mg daily as needed for swelling.  6. Lisinopril 10 mg daily.  7. Methadone 10 mg 3 times a day.  8. Nitrostat 0.4 mg sublingual tablet every five minutes as needed for chest pain.  9. Oxycodone 30 mg 1 tablet every six hours as needed for pain.  10. OxyContin 80 mg 1 tablet 4 times a day.  11. Prochlorperazine 10 mg 1 tablet every four hours as needed for nausea and vomiting.  12. Senna Plus 50 mg/8.6 mg 1 to 2 tablets daily as needed for constipation.  13. Spiriva 18 mcg 1 puff inhaled daily.  14. Symbicort 160 mcg/4.5 mcg inhaled twice a day.   ALLERGIES: Celebrex causes nausea, vomiting, diarrhea. Cymbalta causes headaches, nausea, vomiting.   PAST SURGICAL HISTORY: Noncontributory.   SOCIAL HISTORY: The patient is on Hospice care. Does not drink alcohol. She does smoke.   FAMILY HISTORY: Notable for neuropathy.   REVIEW OF SYSTEMS: Difficult to obtain as the patient is somnolent, however, she denies fevers. Admits to fatigue. EYES: No blurred vision. No eye pain. ENT: No tinnitus. No ear pain. RESPIRATORY: No cough. No wheeze. She does have increased dyspnea. No painful respirations.  CARDIOVASCULAR: No chest pain. Unable to assess orthopnea. No edema. Admits to some palpitations. GI: No nausea, vomiting, diarrhea. GU: Denies dysuria or frequency. ENDOCRINE: Denies increased sweating. INTEGUMENTARY: Denies any new rashes. HEME: She does have easy bruising. MUSCULOSKELETAL: She does have chronic pain. No joint swelling. INTEGUMENTARY: She has a healing wound on the sole of her foot over the  fifth metatarsal joint. NEUROLOGIC: No headaches. PSYCH: She has depression.     PHYSICAL EXAMINATION:   VITAL SIGNS: Temperature 97.5, heart rate 189, currently heart rate is 82, respirations 28, blood pressure 166/92, sating 91% on room air.   GENERAL: Elderly chronically ill appearing female in mild respiratory distress.   EYES: There is a mucopurulent substance over both eyes bilaterally. Anicteric sclerae. Pupils are equal, round, and reactive to light and accommodation.   ENT: External ears and nares are normal. Dry mucous membranes noted. Posterior oropharynx is clear without erythema.   CARDIOVASCULAR: S1, S2, irregularly irregular. No pretibial edema.   PULMONARY: The patient has rales diffusely. Currently not using any accessory muscles of respiration.   ABDOMEN: Soft, nontender, and nondistended without organomegaly.   SKIN: There are ecchymoses bilateral upper and lower extremities. Examination of the lower extremities revealed thickening of the skin consistent with chronic venostasis skin changes. There is an open wound on the sole of the left foot over the fifth metatarsal joint with some eschar. No surrounding erythema. Nondraining.   MUSCULOSKELETAL: No clubbing. No cyanosis noted. Unable to assess range of motion due to the patient's decreased mental status.   PSYCH: The patient is somnolent, oriented to place and self, not oriented to time. Poor judgment given her mental status currently.   LABORATORY DATA: CBC shows white count 18.9, hemoglobin 11.8, hematocrit 36, platelets 262 with an MCV of 96, glucose 188 with a BUN of 12, creatinine 0.9, sodium 137, potassium 4.1, chloride 100, bicarb 25, calcium 8.7, anion gap 12, osmolality 279. Urinalysis shows cloudy urine with 1+ blood, 100 mg/dL protein, positive nitrites, 1+ leukocyte esterase, 3 red blood cells per high-power field, 29 white blood cells per high-power field. ABG shows pH 7.43, pCO2 42, pO2 116, FiO2 40% with a  bicarb of 27.9, sating 98.6% on supplemental oxygen. Troponin 0.57. CK 55. CPK-MB 2.3. B-type Natriuretic Peptide 8135.   Please note this troponin is elevated as compared to October 27th when she came in with a troponin of less than 0.02.   TSH October 27th was 0.949.   I discussed the white blood cell count as it has increased as compared to October 27th when her white blood cell count was 9.5.   EKG shows supraventricular tachycardia at 190 beats per minutes. After she received diltiazem she slowed down and was noted to be in atrial fibrillation with rapid ventricular response.   Chest x-ray showed diffuse pulmonary infiltrates consistent with pulmonary edema   ASSESSMENT AND PLAN: This is a 74 year old female with diastolic congestive heart failure and lung cancer with metastases who presents with progressive shortness of breath found to be in atrial fibrillation with rapid ventricular response and with subsequent pulmonary edema as well as elevated troponins and BNP consistent with most likely acute on chronic diastolic congestive heart failure.  1. Atrial fibrillation with rapid ventricular response. The patient is rate controlled currently after receiving diltiazem in the [facility] department. Will continue p.r.n. metoprolol IV. She will continue her beta-blocker which is Coreg. Will monitor on telemetry. Although her CHADS score is 2, she does have underlying malignancies  and would be concerned about placing her on long-term anticoagulation. This can be addressed prior to discharge.  2. Acute on chronic respiratory failure due to decompensated congestive heart failure. Will diurese the patient aggressively with Lasix 40 mg twice a day. Will continue supplemental oxygen and increase dose. 3. Acute on chronic decompensated diastolic congestive heart failure, NYHA Class IV. This is exacerbated by atrial fibrillation. Will continue rate control and diuresis.  4. Urinary tract infection. Will check  urine cultures. Check blood cultures. Start the patient on ceftriaxone and narrow antibiotics as tolerated.    5. Elevated troponin, multifactorial, is most likely as a result of atrial fibrillation as well as decompensated diastolic congestive heart failure. Will trend cardiac enzymes and hold full dose anticoagulation at this time.  6. Encephalopathy, most likely toxic metabolic from the multiple insults including underlying infectious and acute metabolic issues. Will monitor expectantly.  7. Chronic obstructive pulmonary disease. Will continue her Spiriva and Symbicort.  8. Coronary artery disease. Will continue her lisinopril and Coreg.  9. Lung cancer with metastases and associated chronic pain syndrome. Will continue her home pain regimen.  10. Tobacco abuse. Will initiate nicotine replacement. Unable to perform cessation counseling as the patient has altered mental status at this time.  11. Chronic hyponatremia, stable. Sodium was actually 137 today most likely due to maybe some contraction.  12. Prophylaxis. Lovenox.   CODE STATUS: The patient is a DO NOT RESUSCITATE. Will consult case manager as she was on Hospice.   Case was discussed with the patient's granddaughter who is also one of her decision makers in conjunction with her daughter. The patient's overall poor prognosis was reiterated and granddaughter understands.   TIME SPENT COORDINATING CARE INCLUDING FAMILY EDUCATION: 60 minutes.   ____________________________ Aurther LoftAdaorah E. Violanda Bobeck, DO aeo:drc D: 07/13/2012 08:57:50 ET T: 07/13/2012 09:43:30 ET JOB#: [id number]  cc: Aurther LoftAdaorah E. Norris Brumbach, DO, <Dictator> Corky DownsJaved Masoud, MD Kariem Wolfson E Ranie Chinchilla DO ELECTRONICALLY SIGNED 07/17/2012 6:43

## 2015-01-01 NOTE — H&P (Signed)
PATIENT NAME:  Donna Ho, Donna Ho MR#:  [id number] DATE OF BIRTH:  07-11-1941  DATE OF ADMISSION:  05/13/2012  PRIMARY CARE PHYSICIAN: Corky Downs, MD   REFERRING PHYSICIAN: Dr. Manson Passey from the Emergency Room    Case discussed with Dr. Manson Passey of the Emergency Room. History obtained from the patient. Old records have been reviewed. Imaging studies and EKG reviewed personally.   CHIEF COMPLAINT: Shortness of breath and low back pain.   HISTORY OF PRESENT ILLNESS: The patient is a 74 year old Caucasian female patient with history of lung cancer with metastasis, diastolic congestive heart failure, hypertension, chronic respiratory failure on 4 liters oxygen at home, and a chronic smoker who presents to the Emergency Room complaining of worsening shortness of breath. Initially when EMS arrived at the patient's home, home was completely filled with cigarette smoke. The patient did not have her oxygen on and was saturating in the low 80's. In spite of being kept on her oxygen, the patient was only saturating in the mid 80's and was brought to the Emergency Room. Here the patient is saturating 88 to 89% on 4 liters oxygen and continues to complain of shortness of breath. She does have significant bilateral lower extremity edema along with PND and shortness of breath. Chest x-ray is showing some CHF. She is being admitted to the hospitalist service for further treatment of worsening diastolic CHF with acute component.   The patient was recently seen in the hospital earlier this month, discharged on 04/20/2012 to home on Hospice. The patient continues to be FULL CODE. She is not compliant with her diet, continues to smoke, and does not wear her oxygen as advised.   The patient was ordered a stat CT of the chest to rule out PE but patient has refused the CT. The patient has also refused any kind of surgeries or cardiac cath on discussion. She has refused further work-up of her lung cancer with metastasis in the past and  continues to refuse any further work-up or treatment for the same. Presently the patient complains of pain in her back which seems to be her main concern with shortness of breath being her secondary concern.   PAST MEDICAL HISTORY:  1. Diastolic congestive heart failure. 2. Hypertension. 3. Chronic obstructive pulmonary disease. 4. Chronic respiratory failure on 4 liters oxygen. 5. Coronary artery disease. 6. Severe peripheral neuropathy. 7. Lung cancer with metastasis, noncompliance. 8. Tobacco abuse. 9. Chronic pain syndrome.   SOCIAL HISTORY: The patient is on Hospice care. Continues to smoke. Does not drink alcohol. Lives at home with daughter. Is immobile and is mostly in bed.   FAMILY HISTORY: Neuropathy.   ALLERGIES: Celebrex and Cymbalta.   REVIEW OF SYSTEMS: CONSTITUTIONAL: Complains of fatigue, weakness, and chronic pain. EYES: No blurred or double vision, pain, redness. EARS: No tinnitus, ear pain, hearing loss. RESPIRATORY: Complains of chronic cough, some on and off wheezing. No hemoptysis. Complains of shortness of breath. CARDIOVASCULAR: Has on and off central chest pain along with PND and lower extremity edema. GI: Has nausea but no vomiting, diarrhea, or abdominal pain. GENITOURINARY: No dysuria, hematuria, or frequency. ENDOCRINE: No polyuria, nocturia, or thyroid problems. HEMATOLOGIC/LYMPHATIC: No anemia or bleeding. Does have easy bruising. SKIN: Has no rash, no lesions. MUSCULOSKELETAL: Has chronic back pain and arthritis. NEUROLOGIC: No numbness, weakness, dysarthria. Does have peripheral neuropathy. PSYCHIATRIC: Has anxiety and depression.   MEDICATIONS:  1. Xanax 0.25 mg oral 3 times a day as needed.  2. Vitamin 1 tablet oral daily.  3. Vitamin D3 400 international units 1 tablet oral 3 times a day.  4. Symbicort 80/4.5 2 puffs b.i.d.  5. Oxycodone 30 mg orally every six hours as needed.  6. Plavix 75 mg oral daily.  7. Oxycodone 40 mg oral 4 times a day.   8. Oxycodone 50 mg every four hours p.r.n. for breakthrough pain.  9. Methadone 5 mg oral 3 times a day.  10. Lisinopril 10 mg oral daily.  11. Lipitor 80 mg oral at bedtime.  12. Lasix 20 mg oral daily.  13. Citalopram 40 mg oral daily.  14. Coreg 3.125 mg oral once a day.  15. Aspirin 325 mg oral daily.  16. Aldactone 25 mg half a tablet oral daily.   PHYSICAL EXAMINATION:   VITAL SIGNS: Temperature 97.5, pulse 65, breathing 20 per minute, blood pressure 125/48, saturating 87% on 4 liters oxygen.   GENERAL: Frail elderly Caucasian female patient lying in bed in respiratory distress secondary to her chronic pain. Awake, cooperative with exam.   PSYCHIATRIC: Alert and awake, oriented to person, place, and time. Mood and affect appropriate. Judgment intact.   HEENT: Atraumatic, normocephalic. Oral mucosa moist and pink. External ears and nose normal. No pallor. No icterus. Pupils bilaterally equal and reactive to light.   NECK: Supple. No thyromegaly. No palpable lymph nodes. Trachea midline. No carotid bruit or JVD.   CARDIOVASCULAR: S1, S2, regular rate and rhythm. Peripheral pulses 2+. Has 3+ edema in the lower extremities.   LUNGS: Good air entry on both sides with bilateral basilar crackles.   GI: Soft abdomen, nontender. Bowel sounds present. No hepatosplenomegaly palpable.   SKIN: Warm and dry. No petechiae, rash, ulcers. Has diffuse bruising in bilateral arms and lower extremities.   MUSCULOSKELETAL: No joint swelling, redness, or effusion of the large joints. Has tenderness in the lower back.   NEUROLOGICAL: Motor strength 5 out of 5 in upper and lower extremities. Sensation to fine touch intact all over.   LABORATORY STUDIES: BNP 2009. BUN 18, creatinine 1.01, sodium 121, potassium 4, chloride 85. CK 73. CK-MB 2.4. Troponin less than 0.02. WBC 4.4, hemoglobin 11.3.  Chest x-ray shows some chronic interstitial edema and chronic changes.   EKG shows normal sinus rhythm.  No acute ST-T wave changes.   ASSESSMENT AND PLAN:  1. Acute on chronic diastolic CHF secondary to noncompliance with diet, medications, and continuing to smoke in a patient with noncompliance. The patient is hypoxic in spite of being on 4 liters oxygen at 87% and short of breath. Will admit as inpatient for IV Lasix and diuresis. The patient's first set of cardiac enzymes are negative. The patient refuses any kind of stress test, cardiac cath, or surgeries. Will not check any further cardiac enzymes on this patient. Will treat symptomatically with Lasix. The patient is on Hospice care at this time and is refusing any further work-up including refusal of CT scan of the chest. The patient is on an ACE inhibitor, beta-blocker, aspirin, and statin.  2. Hyponatremia of 121. The patient does have chronic hyponatremia at 130. This is worse secondary to her CHF and needs to be followed. Will recheck in the morning after diuresis. 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  3. Tobacco abuse. I have counseled the patient for at least five minutes regarding quitting smoking. The patient does mention that she has significantly cut down on her smoking. I have also warned her of the grave consequences of smoking in  spite of being on oxygen.  4. Chronic obstructive pulmonary disease. Continue home inhalers. The patient does not have any wheezing at this time. Is not on any steroids.  5. Lung cancer with metastasis. This patient is on Hospice but continues to be FULL CODE at this time. Has been seen by Palliative Care recently in the hospital. The patient seems to have difficulty with goals of care at this time. She does not want any further work-up of her problems and is expecting only symptomatic benefit. Will consult Palliative Care for further help with the case.  6. Chronic pain syndrome. Continue pain medications.   CODE STATUS: FULL CODE.        TIME SPENT: Time spent today  on this case was 70 minutes with more than 50% time spent in coordination of care.   ____________________________ Molinda BailiffSrikar R. Sharmila Wrobleski, MD srs:drc D: 05/13/2012 01:15:53 ET T: 05/13/2012 07:06:45 ET JOB#: [id number]  cc: Wardell HeathSrikar R. Mayer Vondrak, MD, <Dictator> Corky DownsJaved Masoud, MD Orie FishermanSRIKAR R Odyn Turko MD ELECTRONICALLY SIGNED 05/13/2012 7:39

## 2015-01-01 NOTE — Discharge Summary (Signed)
PATIENT NAME:  Donna Ho, Donna Ho MR#:  [id number] DATE OF BIRTH:  1941/04/16  DATE OF ADMISSION:  08/12/2012 DATE OF DISCHARGE:  08/20/2012  ADMISSION DIAGNOSES:  1. Acute on chronic hypoxic respiratory failure. 2. Systemic inflammatory response syndrome.  DISCHARGE DIAGNOSES: 1. Acute on chronic hypoxic respiratory failure.  2. Systemic inflammatory response syndrome.   3. Urinary tract infection.  4. Stage IV lung cancer.  5. History of atrial fibrillation.  6. Congestive heart failure with diastolic dysfunction, compensated.  7. Chronic pain syndrome.   CONSULTS: Palliative care.   HOSPITAL COURSE: 74 year old female who was admitted for acute on chronic respiratory failure with known lung cancer on Hospice who had worsening acute hypoxemia due to aspiration. She was initially on BiPAP, titrated to oxygen. She was on ertapenem, Levaquin, and vancomycin. She continued to have ongoing fevers. Her respiratory status worsened and she was not doing well and was made [facility] on 08/17/2012. The patient will be transferred to Hospice on 08/20/2012.   DISCHARGE MEDICATIONS:  1. Roxanol 20 mg/mL, 0.5 to 1 mL q. 1 to 2 hours p.r.n. pain/dyspnea.  2. OxyFAST 0.5 to 1 mL p.o./sublingual 1 to 2 hours p.r.n. pain/dyspnea.  3. Ativan 0.5 to 1 mg p.o. q. 2 to 4 hours p.r.n. pain/agitation.  4. Ranitidine 150 b.i.d.  5. ABHR 1 PR q. 4 to 6 hours p.r.n.  6. The patient was on a morphine drip here, so she will need substantial medications at discharge given her history of opioid use. She will need high-dose opiates at the Hospice facility.    The patient's family was in agreement for her to be transferred to the Hospice facility.  TIME SPENT: 35 minutes.   ____________________________ Janyth ContesSital P. Juliene PinaMody, MD spm:bjt D: 08/20/2012 10:54:57 ET T: 08/20/2012 11:03:41 ET JOB#: [id number]  cc: Jonasia Coiner P. Juliene PinaMody, MD, <Dictator> Janyth ContesSITAL P Tammie Yanda MD ELECTRONICALLY SIGNED 08/20/2012 11:58

## 2015-01-01 NOTE — H&P (Signed)
PATIENT NAME:  Donna Ho, Donna Ho MR#:  [id number] DATE OF BIRTH:  1941/07/06  DATE OF ADMISSION:  08/12/2012  ADMITTING PHYSICIAN: Enid Baasadhika Ventura Hollenbeck, MD   PRIMARY ONCOLOGIST: Gerarda Fractionimothy Finnegan, MD   CHIEF COMPLAINT: Brought in for respiratory distress and hypoxia.   HISTORY OF PRESENT ILLNESS: Ms. Donna MalletLibby Lalla is a 74 year old ill-looking Caucasian female who does have a history of stage IV lung cancer, currently under Hospice care at home, chronic respiratory failure secondary to her lung cancer and chronic obstructive pulmonary disease, on 4 liters home oxygen, congestive heart failure with diastolic dysfunction and atrial fibrillation who was brought in from home secondary to the above-mentioned complaints. The patient's surrogate decision makes are her daughter, Doreene BurkeMelissa Lange, and also her granddaughter, Lewie ChamberRegan Pickerel. Her granddaughter is currently at the bedside and gives most of the history. According to her, the granddaughter lives with the patient and has been taking care of her over the past few months. She has been having decreased p.o. intake over the last couple of days and has appeared more sleepy than normal. She has not been feeding much today.  The granddaughter woke the patient up and said she was going to make some soup and noodles so that she could eat.  All of a sudden, she felt that the patient was breathing hard, so she took the oxygen off, as the patient was complaining of throat hurting, to prevent dryness of the throat; and then she was giving the patient a sip of drink, and the patient took a large gulp and started coughing and since then has labored breathing. Her lips were turning blue, so EMS was called. She was hypoxic in the 60s on 4 liters of oxygen when EMS arrived, so she was placed on BiPAP and was brought to the ER. In the ER, she had gurgling upper respiratory secretions noise, and after suction her saturations have been  improved, so she is back now on 4 to 5 liters of nasal  cannula.   PAST MEDICAL HISTORY:  1. Diastolic congestive heart failure, chronic.  2. Chronic respiratory failure on 4 liters home oxygen.  3. Chronic obstructive pulmonary disease.  4. Stage IV lung cancer, under Hospice care.  5. Severe peripheral neuropathy.  6. Coronary artery disease, status post stent.  7. Atrial fibrillation.  8. Chronic pain syndrome.  9. 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   PAST SURGICAL HISTORY: Hip fracture repair.   ALLERGIES TO MEDICATIONS: Celebrex and Cymbalta cause nausea and vomiting.   HOME MEDICATIONS:    1. Spironolactone 12.5 mg p.o. daily.  2. Diltiazem 120 mg p.o. daily.  3. Prochlorperazine 10 mg every four hours as needed for nausea or vomiting.  4. Celexa 40 mg p.o. daily.  5. Lasix 20 mg p.o. daily.  6. Multivitamin 1 tablet p.o. daily.  7. Spiriva HandiHaler daily.  8. Symbicort 160/4.5 mcg, one puff b.i.d.  9. Coreg 6.25 mg p.o. b.i.d.  10. Alprazolam 0.25 mg, one to two 2 tablets every four hours as needed for anxiety.  11. Senna Colace one to two tablets every day for constipation.  12. OxyContin 80 mg four times a day.  13. Methadone 10 mg three times a day.  14. Oxycodone 30 mg, one tablet every six hours as needed.   SOCIAL HISTORY: She used to be a heavy smoker, still continues to smoke; but according to granddaughter she has just smoked a cigarette in the last couple of days. She does not drink any  alcohol. She is currently living with her granddaughter and is under Hospice care.   FAMILY HISTORY: The patient and granddaughter are not sure, but the patient's mother had pneumonia and kidney disease.   REVIEW OF SYSTEMS: Review of systems is difficult to be obtained secondary to the patient's weakness and confusion and mental status.   PHYSICAL EXAMINATION:  VITAL SIGNS: Temperature 97 degrees Fahrenheit, pulse 90, respirations 36, pulse oximetry is 100% on nonrebreather mask. Blood pressure recorded to be  170/86.   GENERAL: Elderly female, chronically ill-appearing, sitting in bed in mild respiratory distress.  HEENT: Eyes: Closing both eyes with some periorbital edema. When pried open, the conjunctiva appears normal. Extraocular movements are intact. Anicteric sclerae. Pupils are equal, round, reacting to light. Oropharynx is showing dry mucous membranes but otherwise no erythema, mass or exudates.   NECK: Supple. No thyromegaly, JVD, or carotid bruits.   LUNGS: Moving air bilaterally but coarse rhonchi bibasilar, worse on the right side. No wheeze or crackles. Minimal use of accessory muscles for breathing, especially when repositioning in bed. That is even with minimal exertion.   CARDIOVASCULAR: S1, S2, regular rate and rhythm. No murmurs, rubs, or gallops.   ABDOMEN: Soft, nontender, nondistended. No hepatosplenomegaly. Hypoactive bowel sounds.   EXTREMITIES: No pedal edema. No clubbing or cyanosis. There is a small known open ulcer on the plantar surface of the left foot that is dressed. Unable to palpate dorsalis pedis pulses.   SKIN: Other than the open wound mentioned above, no other new rash or lesions.   NEUROLOGIC: No cranial nerve deficits. Extremely weak in the lower extremities due to her peripheral neuropathy, decreased sensation, able to move both upper extremities but globally weak.   PSYCHOLOGICAL: The patient is very sleepy and lethargic, arousable and oriented to self.  LABORATORY, DIAGNOSTIC AND RADIOLOGICAL DATA: WBC 22.3, hemoglobin 12.1, hematocrit 36.9, platelet count 302.0. Sodium 136, potassium 2.8, chloride 99, bicarbonate 27, BUN 15, creatinine 0.94, glucose 145, calcium 8.1. ALT 17, AST 34, alkaline phosphatase 64, total bilirubin 0.9, albumin 3.0. Troponin 0.04. Urinalysis showing 3+ leukocyte esterase, 2+ bacteria, many  WBCs present. Chest x-ray: No significant interval change. Mildly increased interstitial markings could represent fibrosis or interstitial edema  of cardiac or noncardiac cause. Stable parenchymal density in the left midlung seen. Density in the right suprahilar region appears unchanged. Arterial blood gas showing pH of 7.42, pCO2 49, pO2 86, bicarbonate 32 and saturations of 98% on 50% FiO2.    ASSESSMENT AND PLAN: A 74 year old female with chronic respiratory failure from chronic obstructive pulmonary disease and stage IV lung cancer, on home oxygen, hypertension, peripheral neuropathy and chronic pain syndrome, followed by Home Hospice,  was brought in secondary to hypoxia with saturations in the 60s requiring BiPAP.   1. Acute on chronic hypoxic respiratory failure, possible aspiration: Saturations are  improved after suctioning. Continue to support.  She is downgraded from nonrebreather to nasal cannula. She is on 4 liters at home, and I explained to the family that treatment now is not intended for a complete treatment resolution. It is more for symptomatic treatment, and if she feels better and is back on her home oxygen level she can be discharged back on oral antibiotics. For now, we will start Invanz secondary to its ease of use once a day and will also cover aspiration pneumonia. The family does understand the poor prognosis and agree with the plan. Maybe Hospice can help set up a suctioning device that might help. Also, the  family is requesting some p.r.n. medications for diarrhea at discharge as oral antibiotics do cause diarrhea in the patient, but they were not specific which kind of antibiotics cause diarrhea.  2. Urinary tract infection: The patient does have a chronic Foley catheter in her. Urine cultures have been sent and she was started on Invanz.  3. Stage IV lung cancer: Poor prognosis. As mentioned above, Hospice is following at home. The patient never wanted to go to [facility] as that option was provided again during this admission, too. The family wants to take the patient back home whenever she is ready.  4. Atrial  fibrillation: Continue Cardizem and Coreg for rate control.  5. Congestive heart failure with diastolic dysfunction: Appears stable, continue on Lasix, Aldactone and also Coreg.  6. Chronic pain syndrome and peripheral neuropathy: Continue home medications. She is on different pain medications including methadone, OxyContin and oxycodone at higher doses. 7. Hypokalemia: Will be replaced at this time.  8. GI and deep venous thrombosis prophylaxis with Protonix and TEDs provided.  CODE STATUS: DO NOT RESUSCITATE.   TIME SPENT ON ADMISSION: 50 minutes.   ____________________________ Enid Baas, MD rk:cbb D: 08/12/2012 21:55:32 ET T: 08/13/2012 08:58:52 ET JOB#: [id number]  cc: Enid Baas, MD, <Dictator> Tollie Pizza. Orlie Dakin, MD Enid Baas MD ELECTRONICALLY SIGNED 08/21/2012 13:55

## 2015-01-01 NOTE — Discharge Summary (Signed)
PATIENT NAME:  Donna Ho, Donna MR#:  Ho DATE OF BIRTH:  Feb 18, 1941  DATE OF ADMISSION:  07/13/2012 DATE OF DISCHARGE:  07/17/2012  DIAGNOSES:  1. Atrial fibrillation with RVR currently in normal sinus rhythm.  2. Acute and chronic respiratory failure due to decompensated diastolic congestive heart failure. 3. Elevated troponin. NSTEMI versus demand ischemia.  4. Encephalopathy. 5. Chronic obstructive pulmonary disease. 6. Coronary artery disease. 7. Lung cancer with metastasis associated with chronic pain syndrome. 8. Chronic hyponatremia.  CODE STATUS: DO NOT RESUSCITATE.   DISPOSITION: The patient is being discharged home with hospice.   DISCHARGE MEDICATIONS:  OxyContin 80 mg 4 times a day, citalopram 40 mg daily. Symbicort 1 puff b.i.d. and nitroglycerin p.r.n. oxycodone 30 mg q.6 hours p.r.n., aspirin 81 mg daily. Coreg 6.25 mg b.i.d. methadone 5 mg 2 tablets 3 times a day. Spiriva 18 mcg inhaled daily. Senna Plus 1 to 2 tablets once a day as needed. Xanax 0.25 mg 1 to 2 tablets every four hours p.r.n. Prochlorperazine 10 milligrams q.4 hours p.r.n. Lasix 20 mg daily.  DIET:  Low-sodium.  FOLLOW-UP: With Dr. Juel BurrowMasoud in 1 to 2 weeks after discharge. Hospice has  been resumed.   LABORATORY, DIAGNOSTIC, AND RADIOLOGICAL DATA: Chest x-ray showed interstitial infiltrate, pulmonary vascular congestion. Microbiology: Blood cultures have been negative so far. Urine culture mixed bacterial organisms. White count was 18.9 on admission, 10 by the time of discharge. Hemoglobin 11.8 on admission, 10.2 by the time of discharge, platelet count normal troponins ranging from 0.57 to 2.90. BMP normal.   HOSPITAL COURSE: The patient is a 74 year old female with history of end-stage chronic obstructive pulmonary disease, on home oxygen, metastatic lung cancer, diastolic congestive heart failure presented with progressive shortness of breath. She was found to be in atrial fibrillation with RVR. She was  admitted to the hospital and started on oral Cardizem in addition to her Coreg which resulted in conversion to normal sinus rhythm. She is only on a baby aspirin since she is a poor candidate for long-term anticoagulation. She was treated with Lasix for her decompensated diastolic congestive heart failure exacerbation with good diuresis and improvement in symptoms. The patient was found to have elevated troponins. Her highest troponin was 2.90. She has known history of coronary artery disease and obstruction and has been advised medical management in the past because of her metastatic lung cancer and end-stage chronic obstructive pulmonary disease, she is not a candidate for any intervention. She has end-stage chronic obstructive pulmonary disease which remained stable during the hospitalization. Her presenting symptoms were significantly improved by the time of discharge is being discharged home with  hospice.   She is a DO NOT RESUSCITATE.   TIME SPENT: 45 minutes.    ____________________________ Darrick MeigsSangeeta Saniyah Mondesir, MD sp:ljs D: 07/18/2012 13:59:39 ET T: 07/18/2012 14:20:49 ET JOB#: [id number]  cc: Darrick MeigsSangeeta Laiken Nohr, MD, <Dictator> Darrick MeigsSANGEETA Bobbye Petti MD ELECTRONICALLY SIGNED 07/20/2012 12:08

## 2015-01-01 NOTE — H&P (Signed)
PATIENT NAME:  Donna Ho, Donna Ho MR#:  [id number] DATE OF BIRTH:  [birth year]  DATE OF ADMISSION:  04/16/2012  PRIMARY CARE PHYSICIAN:  Dr. Juel Burrow REFERRING PHYSICIAN: Dr. Marilynne Halsted  CHIEF COMPLAINT: Shortness of breath this morning.   HISTORY OF PRESENT ILLNESS: The patient is a 74 year old Caucasian female with a history of hypertension, congestive heart failure, coronary artery disease, chronic obstructive pulmonary disease, and lung cancer with metastasis, who presents to the ED with shortness of breath this morning. The patient is alert, awake, on BiPAP. According to her and her daughter, the patient recently had worsening leg swelling and developed shortness of breath early this morning so they came to the ED for further evaluation. The patient is bedbound due to neuropathy and a left hip fracture. She is a smoker but quit smoking recently. The patient in addition to shortness of breath complains of cough with sputum but denies any fever or chills. No headache or dizziness. No chest pain or palpitations. She was noted to have elevated BNP and was treated with Lasix and placed on BiPAP. The patient had similar symptoms in the Emergency Department and was admitted for congestive heart failure decompensation and chronic obstructive pulmonary disease exacerbation. During the previous hospitalization. The patient was noted to have a lung nodule and was later diagnosed with lung cancer with metastasis according to the patient's daughter.  PAST MEDICAL HISTORY: As mentioned above: 1. Congestive heart failure. 2. Hypertension. 3. Chronic obstructive pulmonary disease. 4. Coronary artery disease.  5. Severe neuropathy and leg pain. 6. Lung cancer with metastasis.   SOCIAL HISTORY: The patient  has Hospice care at home. She was a smoker but quit recently. She denies any alcohol drinking or illicit drugs.   FAMILY HISTORY:  Neuropathy.     REVIEW OF SYSTEMS: CONSTITUTIONAL: The patient denies any fever or  chills. No headache or dizziness. EYES: No double vision or blurred vision. ENT: No ear pain, hearing loss, epistaxis, dysphagia, or slurred speech. PULMONARY: Positive for shortness of breath, cough with sputum but no hemoptysis. CARDIOVASCULAR: No chest pain, palpitation, orthopnea, nocturnal dyspnea, but has leg edema. GASTROINTESTINAL:  No abdominal pain , nausea, vomiting, or diarrhea. No melena or bloody stool. GU: No dysuria or hematuria. ENDOCRINE: No polyuria, polydipsia, or heat or cold intolerance. HEMATOLOGIC: No easy bruising or bleeding.  NEURO: No syncope, loss of consciousness, or seizure.   ALLERGIES: Celebrex, Cymbalta.   MEDICATIONS:  1. Xanax 0.25 mg p.o. t.i.d. p.r.n. 2. Vitamins 1 tablet p.o. daily.  3. Vitamin D3 400 international units, 1 tablet p.o. t.i.d.  4. Symbicort 18 mcg/4.5 mcg, two puffs  b.i.d.  5. Roxicodone 15 mg p.o. every 6 hours p.r.n.  6. Plavix 75 mg p.o. daily.  7. Oxycodone 80 mg p.o. 1 tablet 4 times a day for Hospice patient. 8. Oxycodone 15 mg every 4 hours p.r.n. for breakthrough pain. 9. Methadone 10 mg t.i.d.  10. Lisinopril 10 mg p.o. daily.  11. Lipitor 80 mg p.o. at bedtime.  12. Lasix 20 mg p.o., 40 mg once daily.  13. Escitalopram 40-mg p.o. tablets, 1.5 tablets p.o. daily.  14. Coreg 3.125 mg p.o. b.i.d.  15. Bactrim DS 800 mg/160 mg, 1 tablet p.o. b.i.d.  16. Aspirin 325 mg p.o. daily.  17. Aldactone 25 mg, 0.5 tablets once daily.   PHYSICAL EXAMINATION:  VITALS:  Blood pressure 123/53, pulse 78, respirations 18, oxygen saturation 100%.   GENERAL: The patient is alert, awake, oriented on BiPAP, in no acute distress.  HEENT: Pupils round, equal, reactive to light and accommodation.   NECK: Supple. No JVD or carotid bruit. No lymphadenopathy. No thyromegaly. Moist oral mucosa. Clear oropharynx.   CARDIOVASCULAR: S1, S2. Regular rate and rhythm. No murmurs or gallops.   PULMONARY: Bilateral air entry. No wheezing, but has  bilateral basilar rales.   ABDOMEN: Soft. No distention or tenderness. No organomegaly. Bowel sounds present.   EXTREMITIES: Bilateral leg edema and tenderness but no erythema. No clubbing or cyanosis. Weak bilateral pedal pulses.   NEUROLOGY: Alert and oriented times three. No focal deficit, but the patient has a history of left hip fracture and she is unable to move her left leg, so this is a limited neurologic exam.   LABORATORY DATA: ABG showed pH 7.35, pCO2 44, pO2 123 with FiO2 40%. Chest x-ray showed bilateral interstitial opacities that may represent interstitial pulmonary edema. BNP 6291. CK 55, CK-MB 1.5, WBC 12, hemoglobin 11.6, platelet 252, glucose 124, BUN 13, creatinine 0.73. Electrolytes normal. Troponin 0.04. EKG shows sinus rhythm at 97 beats per minute.   IMPRESSION:  1. Acute on chronic respiratory failure.  2. Congestive heart failure decompensation, acute on chronic, and diastolic dysfunction.  3. Chronic obstructive pulmonary disease exacerbation.  4. Leukocytosis.  5. Hyponatremia.  6. Severe neuropathy.  7. Hypertension, controlled.  8. Coronary artery disease.  9. Anxiety.  10. Lung cancer with metastasis.   PLAN OF TREATMENT:  1. The patient will be admitted to the telemetry floor. We will continue BiPAP and try to wean off BiPAP.  2. We will increase Lasix to 40 mg b.i.d. IV and continue Aldactone, lisinopril, and Coreg.  3. For chronic obstructive pulmonary disease, we will give solumedrol, Zithromax and nebulizer p.r.n.  4. For coronary artery disease, continue aspirin, Plavix, and Lipitor.  5. For severe neuropathy, we will hold around-the-clock pain medication and give p.r.n. medication due to respiratory failure.  6. We will get a palliative care consult from Dr. Harvie JuniorPhifer due to the patient's multiple medical problems and Hospice care. Discussed the patient's situation and the plan of treatment with the patient and the patient's daughter. Also discussed the  patient's CODE STATUS with them. The patient wants FULL CODE even though she has multiple medical problems including lung cancer with metastasis.   TIME SPENT: About 65 minutes.   ____________________________ Shaune PollackQing Floye Fesler, MD qc:bjt D: 04/16/2012 08:34:28 ET T: 04/16/2012 09:49:23 ET JOB#: [id number]  cc: Shaune PollackQing Allyana Vogan, MD, <Dictator> Corky DownsJaved Masoud, MD Shaune PollackQING Jameer Storie MD ELECTRONICALLY SIGNED 04/16/2012 16:57

## 2015-01-06 NOTE — Discharge Summary (Signed)
PATIENT NAME:  Donna Ho, Donna Ho MR#:  [id number] DATE OF BIRTH:  02-04-1941  DATE OF ADMISSION:  01/31/2012 DATE OF DISCHARGE:  02/07/2012  DISCHARGE DIAGNOSES:  1. Acute on chronic respiratory failure due to congestive heart failure and chronic obstructive pulmonary disease exacerbation. 2. Acute diastolic heart failure. 3. Chronic leg pain. 4. Coronary artery disease. 5. Pulmonary nodule, possible cancer.  6. Deconditioning. 7. Depression.   DISCHARGE MEDICATIONS:  1. Multivitamin 1 tablet daily.  2. Lasix 20 mg 2 tablets once a day. 3. Roxicodone 15 mg 1 tablet every six hours as needed for pain. 4. Citalopram 40 mg 1-1/2 tablets once a day.  5. Symbicort 2 puffs b.i.d.  6. Lipitor 80 mg daily. 7. Vitamin D3 400 international units t.i.d.  8. Aldactone 25 mg half tablet daily.  9. Methadone 10 mg t.i.d.  10. Plavix 75 mg daily. 11. Lisinopril 10 mg daily. 12. Aspirin 325 mg daily. 13. Coreg 3.125 mg p.o. b.i.d.   OXYGEN: 3 liters via nasal cannula.   FOLLOW-UP: Follow-up with Dr. Meredeth Ide in 1 to 2 weeks and also primary doctor.   CONSULTATIONS:  1. Pulmonary consult with Dr. Meredeth Ide  2. Oncology consult with Dr. Orlie Dakin  3. Palliative Care consult with Dr. Harvie Junior    LABORATORY, DIAGNOSTIC, AND RADIOLOGICAL DATA: Troponin 0.17.   CT of the chest showed no PE. New spiculated nodule in the right upper lobe concerning for malignancy. New mediastinal and right hilar lymphadenopathy is nonspecific concerning for metastasis. The patient also has irregular density in the lingula, may be infectious or inflammatory. Multiple subcentimeter nodules throughout the lungs which are new from prior.   Troponins are slightly elevated at 0.17, second one is 0.08, on admission 1.3. Electrolytes on admission sodium 131, potassium 3.8, chloride 95, bicarb 30, BUN 14, creatinine 0.93, glucose 106. CBC WBC 8.9, hemoglobin 10.4, hematocrit 30.7, platelets 176.   PET scan showed abnormal activity in  the posterior aspect of the right upper lobe corresponding to the mass and abnormal activity in the right hilum and right aspect of mediastinum corresponding to enlarged lymph nodes.   Ultrasound of lower extremity showed no evidence of DVT.   HOSPITAL COURSE: The patient is a 74 year old female patient who came in because of trouble breathing and chest tightness. The patient had history of coronary artery disease and multiple MIs before and also history of COPD, oxygen dependent. BNP was elevated to 8677 on admission. Chest x-ray showed some evidence of CHF. She was given Lasix 40 IV which helped her with trouble breathing and also she was given sublingual nitro that relieved chest tightness. The patient was admitted to hospitalist service for acute on chronic respiratory failure likely secondary to acute systolic heart failure. On admission the patient was started on IV Lasix and she continued on ACE inhibitor, beta-blockers, and Aldactone. A 2-D echo was done which showed EF of more than 55%, no wall motion abnormalities, and moderate aortic insufficiency. She was continued on Lasix p.o. along with ACE inhibitors and oxygen. She has shown good improvement and stayed without any trouble breathing, about her baseline short of breath, and she is saturating 92% on 3 liters. She denies any chest pain. The patient was also seen by cardiologist, Dr. Lady Gary, who recommended diuresis and also continue the outpatient medications. Slight elevation of troponins does not appear secondary to acute coronary event but likely secondary to increased demand because of her COPD and CHF. The patient's echo is done and the patient is right now  much better with no more chest pain. The patient has no significant changes on the EKG so she did not have any further testing for that.   For the patient's acute on chronic respiratory failure due to COPD, we continued her on Symbicort along with Spiriva and also DuoNebs and she did not  have any further wheezing during the hospital stay.   Possible pulmonary mass. The patient had a PET scan which showed pulmonary nodule. The patient was seen by Dr. Orlie DakinFinnegan and Dr. Meredeth IdeFleming also. Because the patient is basically bedbound, only walking several steps with assistance, and because of her frail condition discussions were made with the patient regarding biopsy and further work-up but even if she's diagnosed with lung cancer, because of her being bedbound and poor functional performance she is not a surgical candidate or chemotherapy but the same was discussed with the patient by Dr. Meredeth IdeFleming and the patient chose not to have biopsy. Palliative Care consult also was placed and Dr. Harvie JuniorPhifer has seen the patient. The patient is wanting to go home with Hospice so we will arrange for her to go home with Hospice.   She was also seen by physical therapist who recommended and we will arrange home physical therapy, home health, and home Hospice services.  Leg pain due to neuropathy. She is on methadone and oxycodone. Continue them. She complained of severe leg pain yesterday and we did an ultrasound of the lower extremities which did not show any DVT so she can resume her methadone and oxycodone and follow-up with Dr. Meredeth IdeFleming and see if she really wants to have biopsy at a later time. She can follow-up with Dr. Meredeth IdeFleming.   TIME SPENT ON DISCHARGE PREPARATION: More than 30 minutes.   ____________________________ Katha HammingSnehalatha Amilya Haver, MD sk:drc D: 02/07/2012 09:05:34 ET T: 02/08/2012 13:40:54 ET JOB#: [id number]  cc: Katha HammingSnehalatha Arisbeth Purrington, MD, <Dictator> Katha HammingSNEHALATHA Keaunna Skipper MD ELECTRONICALLY SIGNED 02/16/2012 7:28

## 2015-01-06 NOTE — Consult Note (Signed)
General Aspect Pt is a 74 yo female with history of cad s/p pci done in [place] several years ago who was admitted iwth progressive shortness of breath and elevated bnp. She has a minimal troponin elevatioin as well. She has been compliant with her meds but note rather suffen onset of shortness of breath prompting her presentation to the er. She is feeling better since admisison and diuresis. She denies chest pain.She complains of back and leg pain from her chronic neuropathy   Physical Exam:   GEN no acute distress    HEENT PERRL    NECK supple    RESP rhonchi  crackles    CARD Regular rate and rhythm    ABD denies tenderness  normal BS    LYMPH negative neck    EXTR negative cyanosis/clubbing, negative edema    SKIN normal to palpation    NEURO cranial nerves intact, motor/sensory function intact    PSYCH A+O to time, place, person   Review of Systems:   Subjective/Chief Complaint shortness of breath    General: Fatigue    Skin: No Complaints    ENT: No Complaints    Eyes: No Complaints    Neck: No Complaints    Respiratory: Short of breath  Wheezing    Cardiovascular: Tightness    Gastrointestinal: No Complaints    Genitourinary: No Complaints    Vascular: No Complaints    Musculoskeletal: No Complaints    Neurologic: No Complaints    Hematologic: No Complaints    Endocrine: No Complaints    Psychiatric: No Complaints    Review of Systems: All other systems were reviewed and found to be negative    Medications/Allergies Reviewed Medications/Allergies reviewed   Home Medications: Medication Instructions Status  Vitamins   once a day  Active  methadone  2 tab(s)  3 times a day Active  Plavix 75 mg oral tablet 1 tab(s) orally once a day Active  Spiriva 18 mcg inhalation capsule 1 each inhaled once a day Active  spironolactone 25 mg oral tablet 1/2 tab(s) orally  Active  symbicort 160/4.5 mcg  Active  carvedilol 6.25 mg oral tablet 1  tab(s) orally 2 times a day Active  furosemide 40 mg oral tablet 1 tab(s) orally 2 times a day Active  Nitrostat up to 3 doses Active  citalopram 40 mg oral tablet 1 tab(s) orally once a day Active  lisinopril 10 mg oral tablet 1 tab(s) orally once a day Active  Roxicodone 15 mg oral tablet 1 tab(s) orally every 6 hours, As Needed, and 1tab qAM Active  OxyContin 80 mg oral tablet, extended release 1 tab(s) orally 4 times a day every 6 hours Active   EKG:   EKG NSR    Celebrex: N/V/Diarrhea  Cymbalta: Headaches, N/V/Diarrhea    Impression Pt with history of hypertension and copd with oxygen dependcy, cad s/p pci per her report at [facility] who was admitted with progressive shortness of breath. CXR suggested mild volume overload with imiprovement with diiuresis and continued oxygen. She staes she is compliant with her medicaitons. She has no significant ischemia on ekg. Minimla troponinelevation does not appear to be seconary to an acute coronary event but is likely secondary to demand.    Plan 1. Careful diuresis following renal function 2. Conintue current out patient meds 3. Will attempt to obtain records from [place] 4. Await echo to evalaute lv function 5. FUrther recs pending course   Electronic Signatures: Harold Hedge  A (MD)  (Signed 19-May-13 16:54)  Authored: General Aspect/Present Illness, History and Physical Exam, Review of System, Home Medications, EKG , Allergies, Impression/Plan   Last Updated: 19-May-13 16:54 by Dalia HeadingFath, Kenneth A (MD)

## 2015-01-06 NOTE — Consult Note (Signed)
Patient not available at time of consult.  Downstairs getting PET scan.  Full consult to follow once result of the PET scan are known.  Patient will likely need a bronchoscopy or CT-guided biopsy to confirm a diagnosis if PET scan is positive.  Appreciate consult, will follow.  Electronic Signatures: Gerarda FractionFinnegan, Timothy (MD)  (Signed on 21-May-13 14:37)  Authored  Last Updated: 21-May-13 14:37 by Gerarda FractionFinnegan, Timothy (MD)

## 2015-01-06 NOTE — H&P (Signed)
PATIENT NAME:  Donna Ho, [name]:  [id number] DATE OF BIRTH:  June 09, 1941  DATE OF ADMISSION:  01/31/2012  PRIMARY CARE PHYSICIAN: [facility] physician used to be Dr. Hillary BowBarnsman - the patient is being seen by different physicians.  PRIMARY PULMONOLOGIST: Ned ClinesHerbon Fleming, MD - Last seen two years ago.  CARDIOLOGIST: [facility].  REFERRING PHYSICIAN: Chiquita LothJade Sung, MD  CHIEF COMPLAINT: Worsening shortness of breath, chest tightness.   HISTORY OF PRESENT ILLNESS: Ms. Donna Ho is a 74 year old female with significant past medical history of coronary artery disease status post multiple myocardial infarctions and history of chronic obstructive pulmonary disease who is home oxygen dependent on 3 liters nasal cannula all the time. The patient reports she has been having progressive worsening shortness of breath for the last couple of days. The patient's baseline is bedridden, only able to ambulate with a walker one or two steps secondary to recent history of intertrochanteric hip fracture and severe neuropathy. The patient reports her shortness of breath is more upon exertion. The patient reports when her shortness of breath gets worse, she starts to complain of chest tightness. She denies any diaphoresis any dizziness any palpitations or any nausea accompanying these episodes. The patient as well has worsening lower extremity edema over the last three days. Upon presentation to the ED, the patient's BNP was found to be 8677. Recent Echo was done and to be reviewed and had a chest x-ray showing evidence of congestive heart failure picture. The patient was given IV Lasix 40 mg which improved her shortness of breath. As well the patient reports she did take three sublingual nitroglycerin at home which relieved her chest tightness. The patient's EKG did not show any significant changes. The patient reports last time she saw her primary was eight months ago. Last time she saw her pulmonary doctor was two years ago. There was question about  patient compliance with medication, but she reports she has been compliant with her medication. The patient reports she quit smoking last week.   PAST MEDICAL HISTORY:  1. Hypertension.  2. History of coronary artery disease. The patient reports she had myocardial infarction 12 to 15 years ago and she had cardiac catheterization done without any stents placed. 3. History of myocardial infarction in the past.  4. She has severe neuropathy in the lower extremities where she has been followed by neurology and pain management where she is on heavy dose of OxyContin and methadone for that.  5. Chronic obstructive pulmonary disease, on oxygen 3 liters at home.  6. History of lung cancer.   PAST SURGICAL HISTORY: None.   DRUG ALLERGIES: Celebrex.   HOME MEDICATIONS:  1. Aspirin, but unsure of the dose. 2. Tylenol 500 mg every six hours as needed.  3. Coreg 6.25 mg orally 2 times a day.  4. Citalopram 40 mg daily.  5. Lasix 40 mg oral 2 times a day.  6. Lisinopril 10 mg oral daily.  7. Methadone 10 mg oral 2 times a day.  8. Nitrostat sublingual as needed.  9. OxyContin 80 mg oral every six hours  10. Plavix 75 mg oral daily.  11. Roxicodone 15 mg oral every six hours as needed.  12. Spiriva 18 mcg inhalation daily.  13. Spironolactone 12.5 mg daily.  14. Symbicort 160/4.5 mcg 1 puff twice a day. 15. Tramadol 50 mg every six hours as needed.  16. Vitamin 1 tablet daily.   SOCIAL HISTORY: The patient lives with her daughter and granddaughter. She is usually bed bound and  only ambulates 1 or 2 steps maximum with full assistance with a walker. The patient reports she quit smoking last week and used to smoke half pack per day and she has been a smoker for 50 years. No alcohol or drug use.   FAMILY HISTORY: Neuropathy in the family, but no history of early coronary artery disease in the family.  REVIEW OF SYSTEMS: CONSTITUTIONAL: Denies any fever. Has generalized fatigue and weakness. EYES:  Denies any blurry vision, double vision, or pain. ENT: Denies any tinnitus, ear pain, or hearing loss. RESPIRATORY: Has complaints of cough with yellow/white phlegm, minimal. Denies any wheezing. Has history of chronic obstructive pulmonary disease. CARDIOVASCULAR: She denied chest pain, but she reports she has been having chest tightness and worsening edema. Denies any palpitations or syncope. GI: Denies any nausea, vomiting, diarrhea, or abdominal pain. GENITOURINARY: Denies any dysuria, hematuria, or renal colic. ENDOCRINE: Denies any polyuria, polydipsia, heat or cold intolerance. INTEGUMENT: Has chronic lower extremity skin changes and neuropathy. MUSCULOSKELETAL: Has hip pain status post fracture. Has chronic pain syndrome secondary to neuropathic pain. NEUROLOGIC: Denies any ataxia, dementia, or headache. Has neuropathic lower extremity pain and hip pain secondary to fracture. PSYCH: Denies any alcohol or drug abuse.   PHYSICAL EXAMINATION:   VITAL SIGNS: Temperature 97.3, pulse 64, respiratory rate 16, blood pressure 146/66, and saturating 99% on 3 liters nasal cannula.   GENERAL: Frail, cachetic elderly female who is comfortable and in no apparent distress.   HEENT: Head atraumatic, normocephalic. Pupils equal and reactive to light. Pink conjunctivae. Anicteric sclerae. Moist oral mucosa.   NECK: Supple. No thyromegaly. No JVD.   CHEST: The patient had fair air entry bilaterally. No wheezing and no rales, but has bibasilar crackles.   CARDIOVASCULAR: S1 and S2 heard. No rubs, murmurs, or gallops.   ABDOMEN: Soft, nontender, and nondistended. Bowel sounds present.   EXTREMITIES: +2 edema with chronic discoloration.  PSYCHIATRIC: Appropriate affect. Awake and alert x3. Intact judgment and insight.   NEUROLOGIC: Cranial nerves grossly intact. The patient had some weakness in moving her lower extremities, but she reports this is due to her chronic neuropathic pain and status post hip  fracture and this is chronic, nothing new.   PERTINENT LABS: Glucose 101. BNP [id number]. BUN 7, creatinine 0.64, sodium 128, potassium 4.3, chloride 96, and CO2 23. Troponin less than 0.02. White blood cells 11.6, hemoglobin 11.3, hematocrit 33.2, and platelets 226.   ASSESSMENT AND PLAN:  1. Acute on chronic respiratory failure: This appears to be multifactorial, but mainly can be attributed to acute systolic congestive heart failure. The patient does not have any recent Echo done so we will obtain 2-D Echo to evaluate for her ejection fraction, but she has clinical segment systolic congestive heart failure giving her vascular congestion on chest x-ray and worsening lower extremity edema. We will start her on IV Lasix 40 mg twice a day. We will have her on strict ins and outs and we will diurese and we will repeat 2-D echo. The patient is already on an ACE inhibitor, beta blocker, and on Aldactone. Less likely contributing factor is her COPD. The patient does not appear to be having any chronic obstructive pulmonary disease exacerbation but she will be monitored closely and if she develops any wheezing we will have her on Solu-Medrol and Levaquin, but at this point we will hold on steroids and antibiotics. We will continue her on Symbicort, Spiriva, and nebulizer treatment.  2. Hyponatremia: This is secondary to volume overload.  We will continue with diuresis and monitor closely.  3. Chest tightness: This is most likely due to respiratory distress from acute congestive heart failure. The patient has no EKG changes. We will cycle cardiac enzymes.  4. 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 Roxicodone. Those were confirmed with the patient and the family as it is a large dose. 5. Deep vein thrombosis prophylaxis: Subcutaneous heparin. 6. GI prophylaxis: Protonix.           CODE STATUS: FULL CODE.    TOTAL TIME SPENT ON PATIENT CARE: 50 minutes.  ____________________________ Starleen Arms, MD dse:slb D: 01/31/2012 01:21:01 ET T: 01/31/2012 09:38:39 ET JOB#: [id number]  cc: Starleen Arms, MD, <Dictator> Jaquille Kau Teena Irani MD ELECTRONICALLY SIGNED 02/01/2012 0:10

## 2015-01-06 NOTE — Consult Note (Signed)
History of Present Illness:   Reason for Consult PET-positive lung mass, suspicious for underlying malignancy.    HPI   Patient is a 74 year old female with multiple medical problems who presented to the emergency room with acute on chronic respiratory failure.  Subsequent workup included a CT scan which revealed a suspicious pulmonary mass as well as enlarged mediastinal and hilar lymph nodes.  Subsequent PET scan was positive in the same areas.  By report, patient is basically bedbound only walking several steps with full assistance or with a walker. Her shortness of breath is still evident, but improved.  She has no neurologic complaints.  She denies any weight loss.  She has good appetite and denies any nausea, vomiting, constipation, or diarrhea.  She has no chest pain.  Patient otherwise feels well and offers no further specific complaints.  PFSH:   Additional Past Medical and Surgical History Past medical history: Hypertension, CAD/MI, neuropathy, COPD on chronic oxygen, history of lung cancer.  Surgical history: None.  Social history: Positive tobacco x50 years, quit several weeks ago.  Denies alcohol.  Family history: Negative and noncontributory.   Review of Systems:   Performance Status (ECOG) 3    Review of Systems   As per HPI. Otherwise, 10 point system review was negative.   NURSING NOTES: **Vital Signs.:   22-May-13 11:51    Vital Signs Type: Routine    Temperature Temperature (F): 98    Celsius: 36.6    Temperature Source: oral    Pulse Pulse: 60    Pulse source: per Dinamap    Respirations Respirations: 20    Systolic BP Systolic BP: 86    Diastolic BP (mmHg) Diastolic BP (mmHg): 39    Mean BP: 54    BP Source: Dinamap    Pulse Ox % Pulse Ox %: 99    Pulse Ox Activity Level: At rest    Oxygen Delivery: 3L   Physical Exam:   Physical Exam General: Thin, no acute distress. Eyes: Pink conjunctiva, anicteric sclera. HEENT: Normocephalic,  moist mucous membranes, clear oropharnyx. Lungs: Clear to auscultation bilaterally. Heart: Regular rate and rhythm. No rubs, murmurs, or gallops. Abdomen: Soft, nontender, nondistended. No organomegaly noted, normoactive bowel sounds. Musculoskeletal: No edema, cyanosis, or clubbing. Neuro: Alert, answering all questions appropriately. Cranial nerves grossly intact. Skin: No rashes or petechiae noted. Psych: Normal affect.    Celebrex: N/V/Diarrhea  Cymbalta: Headaches, N/V/Diarrhea    Vitamins:   once a day , Active, 0, None   methadone 62m: 2 tab(s)  3 times a day, Active, 0, None   Plavix 75 mg oral tablet: 1 tab(s) orally once a day, Active, 0, None   lisinopril 10 mg oral tablet: 1 tab(s) orally once a day, Active, 0, None   OxyContin 80 mg oral tablet, extended release: 1 tab(s) orally 4 times a day every 6 hours, Active, 0, None   aspirin 325 mg oral tablet: 1 tab(s) orally once a day, Active, 0, None   carvedilol 3.125 mg oral tablet: 1 tab(s) orally 2 times a day, Active, 0, None   furosemide 20 mg oral tablet: 2 tab(s) orally once a day, Active, 0, None   Roxicodone 15 mg oral tablet: 1 tab(s) orally every 6 hours, As Needed- for Pain , Active, 0, None   citalopram 40 mg oral tablet: 1.5 tab(s) orally once a day, Active, 0, None   Symbicort 80 mcg-4.5 mcg/inh inhalation aerosol: 2 puff(s) inhaled 2 times a day, Active, 0, None  Lipitor 80 mg oral tablet: 1 tab(s) orally once a day (at bedtime), Active, 0, None   Vitamin D3 400 intl units oral tablet: 1 tab(s) orally 3 times a day, Active, 0, None   calcium - magnesium - zinc tablets one tablet daily:   , Active, 0, None   Aldactone 25 mg oral tablet: 0.5 tab(s) orally once a day, Active, 0, None  Routine Hem:  20-May-13 06:50    WBC (CBC) 8.9   RBC (CBC) 3.16   Hemoglobin (CBC) 10.4   Hematocrit (CBC) 30.7   Platelet Count (CBC) 176   MCV 97   MCH 33.0   MCHC 34.0   RDW 13.6  Routine Chem:  20-May-13  06:50    Glucose, Serum 106   BUN 14   Creatinine (comp) 0.93   Sodium, Serum 131   Potassium, Serum 3.8   Chloride, Serum 95   CO2, Serum 30   Calcium (Total), Serum 8.1  Hepatic:  20-May-13 06:50    Bilirubin, Total 0.4   Alkaline Phosphatase 52   SGPT (ALT) 13   SGOT (AST) 14   Total Protein, Serum 5.7   Albumin, Serum 3.2  Routine Chem:  20-May-13 06:50    Osmolality (calc) 264   eGFR (African American) >60   eGFR (Non-African American) >60   Anion Gap 6  Routine Hem:  20-May-13 06:50    Neutrophil % 56.5   Lymphocyte % 27.9   Monocyte % 10.7   Eosinophil % 4.3   Basophil % 0.6   Neutrophil # 5.1   Lymphocyte # 2.5   Monocyte # 1.0   Eosinophil # 0.4   Basophil # 0.1  Routine Chem:  20-May-13 06:50    Magnesium, Serum 1.3   Assessment and Plan:  Impression:   PET-positive lung mass, suspicious for underlying malignancy.  Plan:   1.  Lung mass: Highly suspicious for lung cancer.  Patient will require biopsy either by bronchoscopy or CT-guided to confirm diagnosis.  If in fact she is diagnosed with lung cancer given that she is reportedly bedbound and has an extremely poor performance status, it is unlikely that she would be able to tolerate chemotherapy.  She is not appear to be a surgical candidate either.  Radiation may offer some palliation, although it may be difficult for patient to get back and forth in the [place] daily for treatments.  Once the diagnosis is obtained, will further discuss with patient her options.  Palliative care consult may be beneficial in this case as well. consult, will follow.  Electronic Signatures: Delight Hoh (MD)  (Signed [phone] 13:52)  Authored: HISTORY OF PRESENT ILLNESS, PFSH, ROS, NURSING NOTES, PE, ALLERGIES, HOME MEDICATIONS, LABS, ASSESSMENT AND PLAN   Last Updated: 22-May-13 13:52 by Delight Hoh (MD)
